# Patient Record
Sex: Female | Born: 1970 | Race: Black or African American | Hispanic: No | Marital: Married | Smoking: Never smoker
Health system: Southern US, Community
[De-identification: ages and names within clinical notes are randomized; demographics above are authoritative.]

## PROBLEM LIST (undated history)

## (undated) DIAGNOSIS — J45909 Unspecified asthma, uncomplicated: Secondary | ICD-10-CM

## (undated) DIAGNOSIS — E669 Obesity, unspecified: Secondary | ICD-10-CM

## (undated) DIAGNOSIS — R87619 Unspecified abnormal cytological findings in specimens from cervix uteri: Secondary | ICD-10-CM

## (undated) DIAGNOSIS — N926 Irregular menstruation, unspecified: Secondary | ICD-10-CM

## (undated) DIAGNOSIS — I10 Essential (primary) hypertension: Secondary | ICD-10-CM

## (undated) DIAGNOSIS — J4521 Mild intermittent asthma with (acute) exacerbation: Secondary | ICD-10-CM

## (undated) DIAGNOSIS — N8500 Endometrial hyperplasia, unspecified: Secondary | ICD-10-CM

## (undated) HISTORY — DX: Essential (primary) hypertension: I10

## (undated) HISTORY — DX: Irregular menstruation, unspecified: N92.6

## (undated) HISTORY — DX: Obesity, unspecified: E66.9

## (undated) HISTORY — DX: Endometrial hyperplasia, unspecified: N85.00

## (undated) HISTORY — DX: Unspecified abnormal cytological findings in specimens from cervix uteri: R87.619

---
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
## 2002-03-19 DIAGNOSIS — I10 Essential (primary) hypertension: Secondary | ICD-10-CM

## 2002-03-19 HISTORY — DX: Essential (primary) hypertension: I10

## 2005-05-29 ENCOUNTER — Ambulatory Visit: Payer: Self-pay | Admitting: Family Medicine

## 2007-11-19 ENCOUNTER — Ambulatory Visit: Payer: Self-pay | Admitting: Unknown Physician Specialty

## 2009-03-19 HISTORY — PX: KNEE SURGERY: SHX244

## 2009-11-08 ENCOUNTER — Ambulatory Visit: Payer: Self-pay

## 2010-03-29 DIAGNOSIS — E282 Polycystic ovarian syndrome: Secondary | ICD-10-CM | POA: Insufficient documentation

## 2012-05-16 ENCOUNTER — Ambulatory Visit: Payer: Self-pay

## 2012-06-09 ENCOUNTER — Other Ambulatory Visit: Payer: Self-pay

## 2012-06-09 ENCOUNTER — Encounter: Payer: Self-pay | Admitting: General Surgery

## 2012-06-09 ENCOUNTER — Ambulatory Visit (INDEPENDENT_AMBULATORY_CARE_PROVIDER_SITE_OTHER): Payer: BC Managed Care – PPO | Admitting: General Surgery

## 2012-06-09 VITALS — BP 112/64 | HR 74 | Resp 14 | Ht 68.0 in | Wt 288.0 lb

## 2012-06-09 DIAGNOSIS — N63 Unspecified lump in unspecified breast: Secondary | ICD-10-CM

## 2012-06-09 DIAGNOSIS — R928 Other abnormal and inconclusive findings on diagnostic imaging of breast: Secondary | ICD-10-CM | POA: Insufficient documentation

## 2012-06-09 NOTE — Progress Notes (Signed)
Patient ID: Ann Morgan, female   DOB: 09-26-1970, 42 y.o.   MRN: [id number]  Chief Complaint  Patient presents with  . mammogram    HPI Edit Ann Morgan is a 42 y.o. female is here today following up from her mammogram 05/16/2012 cat. 2. Patient reports no breast pain. HPI  Past Medical History  Diagnosis Date  . Hypertension 2004    Past Surgical History  Procedure Laterality Date  . Knee surgery Left jan 2011    orthoscopic    Family History  Problem Relation Age of Onset  . Hypertension Mother   . Diabetes Brother   . Hypertension Brother     Social History History  Substance Use Topics  . Smoking status: Never Smoker   . Smokeless tobacco: Never Used  . Alcohol Use: No    No Known Allergies  Current Outpatient Prescriptions  Medication Sig Dispense Refill  . amLODipine (NORVASC) 10 MG tablet Take 10 mg by mouth daily.      . metFORMIN (GLUMETZA) 500 MG (MOD) 24 hr tablet Take 500 mg by mouth daily with breakfast.      . Prenatal Vit-Fe Fumarate-FA (PRENATAL MULTIVITAMIN) TABS Take 1 tablet by mouth daily at 12 noon.      . triamterene-hydrochlorothiazide (DYAZIDE) 37.5-25 MG per capsule Take 1 capsule by mouth every morning.       No current facility-administered medications for this visit.    Review of Systems Review of Systems  Constitutional: Negative.   Respiratory: Negative.   Cardiovascular: Negative.     Blood pressure 112/64, pulse 74, resp. rate 14, height 5\' 8"  (1.727 m), weight 288 lb (130.636 kg), last menstrual period 05/28/2012.  Physical Exam Physical Exam  Constitutional: She appears well-developed and well-nourished.  HENT:  Head: Normocephalic and atraumatic.  Right Ear: External ear normal.  Left Ear: External ear normal.  Cardiovascular: Normal rate, regular rhythm and normal heart sounds.   Pulmonary/Chest: Breath sounds normal. Right breast exhibits no inverted nipple, no mass, no nipple discharge, no skin change and no tenderness.  Left breast exhibits no inverted nipple, no mass, no nipple discharge, no skin change and no tenderness.  Left breast FNA.  Abdominal: Soft. Bowel sounds are normal.    Data Reviewed Bilateral mammograms dated February 11th 2014 showed scattered fibroglandular densities. A nodular density was appreciated in the upper central portion of the left breast for which additional views were requested. By red-0. Focal spot compression views and ultrasound of the left breast at 05/16/2012 showed a well-circumscribed nodule at the 12:00 position the depth. Ultrasound showed a 3 x 5 x 6 cm mass at the 11:00 position corresponding to the mammographic abnormality. BIRAD-4.    Assessment    Left breast cyst.    Plan    The cyst resolved completely on aspiration. Arrangements were made for followup exam and repeat ultrasound in 6 months.       Ann Morgan 06/09/2012, 8:11 PM

## 2012-12-10 ENCOUNTER — Encounter: Payer: Self-pay | Admitting: General Surgery

## 2012-12-10 ENCOUNTER — Other Ambulatory Visit: Payer: BC Managed Care – PPO

## 2012-12-10 ENCOUNTER — Ambulatory Visit (INDEPENDENT_AMBULATORY_CARE_PROVIDER_SITE_OTHER): Payer: BC Managed Care – PPO | Admitting: General Surgery

## 2012-12-10 VITALS — BP 124/84 | HR 76 | Resp 16 | Ht 68.0 in | Wt 280.0 lb

## 2012-12-10 DIAGNOSIS — N63 Unspecified lump in unspecified breast: Secondary | ICD-10-CM

## 2012-12-10 DIAGNOSIS — N6009 Solitary cyst of unspecified breast: Secondary | ICD-10-CM

## 2012-12-10 DIAGNOSIS — N6002 Solitary cyst of left breast: Secondary | ICD-10-CM

## 2012-12-10 NOTE — Patient Instructions (Addendum)
Continue self breast exams. Call office for any new breast issues or concerns. 

## 2012-12-10 NOTE — Progress Notes (Signed)
Patient ID: Ann Morgan, female   DOB: 05-05-1970, 42 y.o.   MRN: [id number]  Chief Complaint  Patient presents with  . Other    breast     HPI Ann Morgan is a 42 y.o. female.  Here today for 6 month breast follow up.  In March 2014 she had fluid drawn from a cyst left breast 12 o'clock position. Denies any new breast issues. The patient had an abnormal mammogram in spring 2014. Ultrasound guided aspiration showed complete resolution of the cyst. This is a plan followup exam to assess for cyst recurrent. HPI  Past Medical History  Diagnosis Date  . Hypertension 2004    Past Surgical History  Procedure Laterality Date  . Knee surgery Left jan 2011    orthoscopic    Family History  Problem Relation Age of Onset  . Hypertension Mother   . Diabetes Brother   . Hypertension Brother   . Breast cancer Maternal Grandmother     Social History History  Substance Use Topics  . Smoking status: Never Smoker   . Smokeless tobacco: Never Used  . Alcohol Use: No    No Known Allergies  Current Outpatient Prescriptions  Medication Sig Dispense Refill  . metFORMIN (GLUMETZA) 500 MG (MOD) 24 hr tablet Take 500 mg by mouth daily with breakfast.      . Prenatal Vit-Fe Fumarate-FA (PRENATAL MULTIVITAMIN) TABS Take 1 tablet by mouth daily at 12 noon.      . triamterene-hydrochlorothiazide (DYAZIDE) 37.5-25 MG per capsule Take 1 capsule by mouth every morning.       No current facility-administered medications for this visit.    Review of Systems Review of Systems  Constitutional: Negative.   Respiratory: Negative.   Cardiovascular: Negative.     Blood pressure 124/84, pulse 76, resp. rate 16, height 5\' 8"  (1.727 m), weight 280 lb (127.007 kg), last menstrual period 11/17/2012.  Physical Exam Physical Exam  Constitutional: She is oriented to person, place, and time. She appears well-developed and well-nourished.  Neck: Neck supple.  Cardiovascular: Normal rate and regular rhythm.    Pulmonary/Chest: Effort normal and breath sounds normal. Right breast exhibits no inverted nipple, no mass, no nipple discharge, no skin change and no tenderness. Left breast exhibits no inverted nipple, no mass, no nipple discharge, no skin change and no tenderness.  Lymphadenopathy:    She has no cervical adenopathy.    She has no axillary adenopathy.  Neurological: She is alert and oriented to person, place, and time.  Skin: Skin is warm and dry.    Data Reviewed Ultrasound examination of the left breast and 11:00 position 10 cm from the nipple showed a slightly lobulated anechoic lesion with faint acoustic enhancement. She was amenable to aspiration. This was completed using 1 cc of 1% plain Xylocaine. The lesion resolved completely. 2 slides were prepared for cytology as the area had recurred. The procedure was well tolerated.  Assessment    Benign breast exam, recurrent breast cyst.    Plan    The patient will be contacted when her cytology is returned. Assuming a benign result, arrangements will be made for her follow up mammogram in 6 months (bilateral).       Earline Mayotte 12/11/2012, 5:57 PM

## 2012-12-11 ENCOUNTER — Encounter: Payer: Self-pay | Admitting: General Surgery

## 2012-12-11 DIAGNOSIS — N6009 Solitary cyst of unspecified breast: Secondary | ICD-10-CM | POA: Insufficient documentation

## 2012-12-12 LAB — FINE-NEEDLE ASPIRATION

## 2012-12-16 ENCOUNTER — Telehealth: Payer: Self-pay

## 2012-12-16 ENCOUNTER — Telehealth: Payer: Self-pay | Admitting: *Deleted

## 2012-12-16 NOTE — Telephone Encounter (Signed)
ERR

## 2012-12-16 NOTE — Telephone Encounter (Signed)
Notified patient as instructed, FNA fine, patient pleased. Discussed follow-up appointments for 6 months, patient agrees

## 2013-03-19 HISTORY — PX: BREAST CYST ASPIRATION: SHX578

## 2013-05-06 ENCOUNTER — Encounter: Payer: Self-pay | Admitting: General Surgery

## 2013-05-06 ENCOUNTER — Ambulatory Visit: Payer: Self-pay | Admitting: General Surgery

## 2013-05-20 ENCOUNTER — Ambulatory Visit (INDEPENDENT_AMBULATORY_CARE_PROVIDER_SITE_OTHER): Payer: BC Managed Care – PPO | Admitting: General Surgery

## 2013-05-20 ENCOUNTER — Encounter: Payer: Self-pay | Admitting: General Surgery

## 2013-05-20 VITALS — BP 134/70 | HR 76 | Resp 14 | Ht 69.0 in | Wt 299.0 lb

## 2013-05-20 DIAGNOSIS — Z1239 Encounter for other screening for malignant neoplasm of breast: Secondary | ICD-10-CM

## 2013-05-20 DIAGNOSIS — N63 Unspecified lump in unspecified breast: Secondary | ICD-10-CM

## 2013-05-20 NOTE — Patient Instructions (Signed)
Patient to return as needed. 

## 2013-05-20 NOTE — Progress Notes (Signed)
Patient ID: Ann Morgan, female   DOB: [birth year], 43 y.o.   MRN: [id number]  Chief Complaint  Patient presents with  . Follow-up    mammogram    HPI Ann Morgan is a 43 y.o. female. who presents for a breast evaluation. The most recent mammogram was done on 05/07/13.Patient does perform regular self breast checks and gets regular mammograms done.  The patient has not appreciated any recurrent breast cysts.  HPI  Past Medical History  Diagnosis Date  . Hypertension 2004    Past Surgical History  Procedure Laterality Date  . Knee surgery Left jan 2011    orthoscopic    Family History  Problem Relation Age of Onset  . Hypertension Mother   . Diabetes Brother   . Hypertension Brother   . Breast cancer Maternal Grandmother     Social History History  Substance Use Topics  . Smoking status: Never Smoker   . Smokeless tobacco: Never Used  . Alcohol Use: No    No Known Allergies  Current Outpatient Prescriptions  Medication Sig Dispense Refill  . metFORMIN (GLUMETZA) 500 MG (MOD) 24 hr tablet Take 500 mg by mouth daily with breakfast.      . Prenatal Vit-Fe Fumarate-FA (PRENATAL MULTIVITAMIN) TABS Take 1 tablet by mouth daily at 12 noon.      . triamterene-hydrochlorothiazide (DYAZIDE) 37.5-25 MG per capsule Take 1 capsule by mouth every morning.       No current facility-administered medications for this visit.    Review of Systems Review of Systems  Constitutional: Negative.   Respiratory: Negative.   Cardiovascular: Negative.     Blood pressure 134/70, pulse 76, resp. rate 14, height 5\' 9"  (1.753 m), weight 299 lb (135.626 kg), last menstrual period 05/11/2013.  Physical Exam Physical Exam  Constitutional: She is oriented to person, place, and time. She appears well-developed and well-nourished.  Eyes: Conjunctivae are normal.  Neck: Neck supple.  Cardiovascular: Normal rate, regular rhythm and normal heart sounds.   Pulmonary/Chest: Breath sounds normal.  Right breast exhibits no inverted nipple, no mass, no nipple discharge, no skin change and no tenderness. Left breast exhibits no inverted nipple, no mass, no nipple discharge, no skin change and no tenderness.  Lymphadenopathy:    She has no cervical adenopathy.    She has no axillary adenopathy.  Neurological: She is alert and oriented to person, place, and time.  Skin: Skin is warm and dry.    Data Reviewed Bilateral mammograms May 06, 2013 were reviewed and compared to previous studies. No interval change. BI-RAD-1. Cyst aspiration in September 2014 showed proteinaceous material. Assessment    Benign breast exam.     Plan    Annual follow up with her primary physician with screening mammograms is recommended. She was encouraged to call should she appreciate any change on her own self exam or if new mammographic issues developed.        Robert Bellow 05/21/2013, 5:53 PM

## 2013-10-19 DIAGNOSIS — J4521 Mild intermittent asthma with (acute) exacerbation: Secondary | ICD-10-CM

## 2013-10-19 HISTORY — DX: Mild intermittent asthma with (acute) exacerbation: J45.21

## 2014-01-18 ENCOUNTER — Encounter: Payer: Self-pay | Admitting: General Surgery

## 2014-05-13 ENCOUNTER — Ambulatory Visit: Payer: Self-pay | Admitting: Obstetrics and Gynecology

## 2014-10-27 DIAGNOSIS — I83813 Varicose veins of bilateral lower extremities with pain: Secondary | ICD-10-CM | POA: Insufficient documentation

## 2015-03-31 ENCOUNTER — Other Ambulatory Visit: Payer: Self-pay | Admitting: *Deleted

## 2015-03-31 DIAGNOSIS — Z1231 Encounter for screening mammogram for malignant neoplasm of breast: Secondary | ICD-10-CM

## 2015-04-06 DIAGNOSIS — Z113 Encounter for screening for infections with a predominantly sexual mode of transmission: Secondary | ICD-10-CM | POA: Insufficient documentation

## 2015-05-09 DIAGNOSIS — S6992XA Unspecified injury of left wrist, hand and finger(s), initial encounter: Secondary | ICD-10-CM | POA: Insufficient documentation

## 2015-05-16 ENCOUNTER — Ambulatory Visit
Admission: RE | Admit: 2015-05-16 | Discharge: 2015-05-16 | Disposition: A | Discharge status: Home / Self Care | Payer: BLUE CROSS/BLUE SHIELD | Source: Ambulatory Visit | Attending: *Deleted | Admitting: *Deleted

## 2015-05-16 DIAGNOSIS — Z1231 Encounter for screening mammogram for malignant neoplasm of breast: Secondary | ICD-10-CM | POA: Diagnosis not present

## 2015-05-16 LAB — HM PAP SMEAR: HM Pap smear: NORMAL

## 2015-06-08 ENCOUNTER — Ambulatory Visit
Admission: EM | Admit: 2015-06-08 | Discharge: 2015-06-08 | Disposition: A | Discharge status: Home / Self Care | Payer: BLUE CROSS/BLUE SHIELD | Attending: Emergency Medicine | Admitting: Emergency Medicine

## 2015-06-08 ENCOUNTER — Encounter: Payer: Self-pay | Admitting: *Deleted

## 2015-06-08 DIAGNOSIS — J101 Influenza due to other identified influenza virus with other respiratory manifestations: Secondary | ICD-10-CM | POA: Diagnosis not present

## 2015-06-08 HISTORY — DX: Unspecified asthma, uncomplicated: J45.909

## 2015-06-08 LAB — RAPID INFLUENZA A&B ANTIGENS (ARMC ONLY): INFLUENZA A (ARMC): NEGATIVE

## 2015-06-08 LAB — RAPID INFLUENZA A&B ANTIGENS: Influenza B (ARMC): POSITIVE — AB

## 2015-06-08 NOTE — Discharge Instructions (Signed)
Take over the counter tylenol or ibuprofen as needed. Rest. Drink plenty of fluids.   Follow up with your primary care physician this week as needed. Return to Urgent care for new or worsening concerns.    Influenza, Adult Influenza ("the flu") is a viral infection of the respiratory tract. It occurs more often in winter months because people spend more time in close contact with one another. Influenza can make you feel very sick. Influenza easily spreads from person to person (contagious). CAUSES  Influenza is caused by a virus that infects the respiratory tract. You can catch the virus by breathing in droplets from an infected person's cough or sneeze. You can also catch the virus by touching something that was recently contaminated with the virus and then touching your mouth, nose, or eyes. RISKS AND COMPLICATIONS You may be at risk for a more severe case of influenza if you smoke cigarettes, have diabetes, have chronic heart disease (such as heart failure) or lung disease (such as asthma), or if you have a weakened immune system. Elderly people and pregnant women are also at risk for more serious infections. The most common problem of influenza is a lung infection (pneumonia). Sometimes, this problem can require emergency medical care and may be life threatening. SIGNS AND SYMPTOMS  Symptoms typically last 4 to 10 days and may include:  Fever.  Chills.  Headache, body aches, and muscle aches.  Sore throat.  Chest discomfort and cough.  Poor appetite.  Weakness or feeling tired.  Dizziness.  Nausea or vomiting. DIAGNOSIS  Diagnosis of influenza is often made based on your history and a physical exam. A nose or throat swab test can be done to confirm the diagnosis. TREATMENT  In mild cases, influenza goes away on its own. Treatment is directed at relieving symptoms. For more severe cases, your health care provider may prescribe antiviral medicines to shorten the sickness.  Antibiotic medicines are not effective because the infection is caused by a virus, not by bacteria. HOME CARE INSTRUCTIONS  Take medicines only as directed by your health care provider.  Use a cool mist humidifier to make breathing easier.  Get plenty of rest until your temperature returns to normal. This usually takes 3 to 4 days.  Drink enough fluid to keep your urine clear or pale yellow.  Cover yourmouth and nosewhen coughing or sneezing,and wash your handswellto prevent thevirusfrom spreading.  Stay homefromwork orschool untilthe fever is gonefor at least 76full day. PREVENTION  An annual influenza vaccination (flu shot) is the best way to avoid getting influenza. An annual flu shot is now routinely recommended for all adults in the [place] IF:  You experiencechest pain, yourcough worsens,or you producemore mucus.  Youhave nausea,vomiting, ordiarrhea.  Your fever returns or gets worse. SEEK IMMEDIATE MEDICAL CARE IF:  You havetrouble breathing, you become short of breath,or your skin ornails becomebluish.  You have severe painor stiffnessin the neck.  You develop a sudden headache, or pain in the face or ear.  You have nausea or vomiting that you cannot control. MAKE SURE YOU:   Understand these instructions.  Will watch your condition.  Will get help right away if you are not doing well or get worse.   This information is not intended to replace advice given to you by your health care provider. Make sure you discuss any questions you have with your health care provider.   Document Released: 03/02/2000 Document Revised: 03/26/2014 Document Reviewed: 06/04/2011 Elsevier Interactive Patient  Education ©2016 [employer] ° °

## 2015-06-08 NOTE — ED Notes (Signed)
Patient started having symptoms of body aches, fever and cough yesterday. Patient reports that she had the flu 3 weeks ago and feels like she has the flu again.

## 2015-06-08 NOTE — ED Provider Notes (Signed)
[facility]  ____________________________________________  Time seen: Approximately 8:53 AM  I have reviewed the triage vital signs and the nursing notes.   HISTORY  Chief Complaint Fever and Generalized Body Aches   HPI Ann Morgan is a 45 y.o. female presents with complaints of runny nose, nasal congestion, body aches and cough since yesterday morning. Patient reports that she did have a fever that was temperature maximum of 102 orally yesterday. Denies sore throat. Patient reports that she did take medications over-the-counter yesterday for the same but states has not taken any medications or antipyretics today. Patient reports that she does feel much better today. Reports continues to eat and drink well.  Patient reports that 3 weeks ago she tested positive for influenza. Patient states that she did not take the Tamiflu. Patient reports that she feels the same as she didn't put she had the flu 3 weeks ago. Patient states that all symptoms were quick onset yesterday. Patient reports that she does have a history of mild asthma. Patient states that she has a home albuterol inhaler as needed. Patient denies wheezing the last 2 days.  Denies chest pain, shortness of breath, abdominal pain, dysuria, neck pain, back pain, rash, dizziness, weakness, abdominal pain.  Patient's last menstrual period was 04/24/2015. Patient reports that she normally has irregular menstruals. Patient states that she is currently on Provera. States that her OB/GYN is currently following and managing her irregular menstruals. Patient states that her and her husband have been sexually active since they've been married and has never used protection as she has never been able to become pregnant. Denies chance of pregnancy.   Past Medical History  Diagnosis Date  . Hypertension 2004  . Asthma     Patient Active Problem List   Diagnosis Date Noted  . Breast cyst 12/11/2012  . Abnormal mammogram  06/09/2012    Past Surgical History  Procedure Laterality Date  . Knee surgery Left jan 2011    orthoscopic  . Breast cyst aspiration Left 2015    benign    Current Outpatient Rx  Name  Route  Sig  Dispense  Refill  . losartan (COZAAR) 50 MG tablet   Oral   Take 50 mg by mouth daily.         . MedroxyPROGESTERone Acetate (PROVERA PO)   Oral   Take by mouth.         . Prenatal Vit-Fe Fumarate-FA (PRENATAL MULTIVITAMIN) TABS   Oral   Take 1 tablet by mouth daily at 12 noon.         . triamterene-hydrochlorothiazide (DYAZIDE) 37.5-25 MG per capsule   Oral   Take 1 capsule by mouth every morning.         . metFORMIN (GLUMETZA) 500 MG (MOD) 24 hr tablet   Oral   Take 500 mg by mouth daily with breakfast.           Allergies Review of patient's allergies indicates no known allergies.  Family History  Problem Relation Age of Onset  . Hypertension Mother   . Diabetes Brother   . Hypertension Brother   . Breast cancer Maternal Grandmother     Social History Social History  Substance Use Topics  . Smoking status: Never Smoker   . Smokeless tobacco: Never Used  . Alcohol Use: No    Review of Systems Constitutional: Subjective fevers. eyes: No visual changes. ENT: No sore throat. positive runny nose, nasal congestion and cough.  Cardiovascular: Denies chest  pain. Respiratory: Denies shortness of breath. Gastrointestinal: No abdominal pain.  No nausea, no vomiting.  No diarrhea.  No constipation. Genitourinary: Negative for dysuria. Musculoskeletal: Negative for back pain. Skin: Negative for rash. Neurological: Negative for headaches, focal weakness or numbness.  10-point ROS otherwise negative.  ____________________________________________   PHYSICAL EXAM:  VITAL SIGNS: ED Triage Vitals  Enc Vitals Group     BP 06/08/15 0837 151/80 mmHg     Pulse Rate 06/08/15 0837 97     Resp 06/08/15 0837 18     Temp 06/08/15 0837 98.5 F (36.9 C)      Temp Source 06/08/15 0837 Oral     SpO2 06/08/15 0837 99 %     Weight 06/08/15 0837 315 lb (142.883 kg)     Height 06/08/15 0837 5\' 9"  (1.753 m)     Head Cir --      Peak Flow --      Pain Score 06/08/15 0843 0     Pain Loc --      Pain Edu? --      Excl. in [place]? --   Constitutional: Alert and oriented. Well appearing and in no acute distress. Eyes: Conjunctivae are normal. PERRL. EOMI. Head: Atraumatic. No sinus tenderness to palpation. No swelling. No erythema.  Ears: no erythema, normal TMs bilaterally.   Nose:Nasal congestion with clear rhinorrhea  Mouth/Throat: Mucous membranes are moist. No pharyngeal erythema. No tonsillar swelling or exudate.  Neck: No stridor.  No cervical spine tenderness to palpation. Hematological/Lymphatic/Immunilogical: No cervical lymphadenopathy. Cardiovascular: Normal rate, regular rhythm. Grossly normal heart sounds.  Good peripheral circulation. Respiratory: Normal respiratory effort.  No retractions. Lungs CTAB.No wheezes, rales or rhonchi. Good air movement.  Gastrointestinal: Soft and nontender. Normal Bowel sounds. No CVA tenderness. Musculoskeletal: No lower or upper extremity tenderness nor edema. No cervical, thoracic or lumbar tenderness to palpation. Neurologic:  Normal speech and language. No gross focal neurologic deficits are appreciated. No gait instability. Skin:  Skin is warm, dry and intact. No rash noted. Psychiatric: Mood and affect are normal. Speech and behavior are normal.  ____________________________________________   LABS (all labs ordered are listed, but only abnormal results are displayed)  Labs Reviewed  RAPID INFLUENZA A&B ANTIGENS ([place]) - Abnormal; Notable for the following:    Influenza B ([facility]) POSITIVE (*)    All other components within normal limits    INITIAL IMPRESSION / ASSESSMENT AND PLAN / ED COURSE  Pertinent labs & imaging results that were available during my care of the patient were reviewed by  me and considered in my medical decision making (see chart for details).   Well-appearing patient. No acute distress. Presents for the complaints of 1 day history of runny nose, distention, cough, body aches and reported fever yesterday. Reports feeling similar to when she had the flu recently. Patient reports she is recently around young kids have been sick recently. Lungs clear throughout. Abdomen soft and nontender. Moist mucous membranes. Suspect viral illness. Will also evaluate for influenza.  Influenza b positive. Patient states that she does not want Tamiflu or any prescription medicines. Patient states that she will take over-the-counter medications as needed. Encourage rest, fluids, over-the-counter Tylenol or ibuprofen as needed. Encourage PCP follow up.  Discussed follow up with Primary care physician this week. Discussed follow up and return parameters including no resolution or any worsening concerns. Patient verbalized understanding and agreed to plan.   ____________________________________________   FINAL CLINICAL IMPRESSION(S) / ED DIAGNOSES  Final diagnoses:  Influenza B      Note: This dictation was prepared with Dragon dictation along with smaller phrase technology. Any transcriptional errors that result from this process are unintentional.    Marylene Land, NP 06/08/15 [phone]

## 2015-09-19 DIAGNOSIS — Z6841 Body Mass Index (BMI) 40.0 and over, adult: Secondary | ICD-10-CM | POA: Insufficient documentation

## 2016-03-13 ENCOUNTER — Other Ambulatory Visit: Payer: Self-pay | Admitting: *Deleted

## 2016-03-13 DIAGNOSIS — Z1231 Encounter for screening mammogram for malignant neoplasm of breast: Secondary | ICD-10-CM

## 2016-04-20 ENCOUNTER — Ambulatory Visit
Admission: RE | Admit: 2016-04-20 | Discharge: 2016-04-20 | Disposition: A | Discharge status: Home / Self Care | Payer: BLUE CROSS/BLUE SHIELD | Source: Ambulatory Visit | Attending: *Deleted | Admitting: *Deleted

## 2016-04-20 ENCOUNTER — Other Ambulatory Visit: Payer: Self-pay | Admitting: *Deleted

## 2016-04-20 DIAGNOSIS — Z1231 Encounter for screening mammogram for malignant neoplasm of breast: Secondary | ICD-10-CM | POA: Diagnosis present

## 2017-01-01 DIAGNOSIS — E7439 Other disorders of intestinal carbohydrate absorption: Secondary | ICD-10-CM | POA: Insufficient documentation

## 2017-01-01 DIAGNOSIS — N3281 Overactive bladder: Secondary | ICD-10-CM | POA: Insufficient documentation

## 2017-01-15 ENCOUNTER — Other Ambulatory Visit: Payer: Self-pay | Admitting: Obstetrics and Gynecology

## 2017-02-04 DIAGNOSIS — M222X1 Patellofemoral disorders, right knee: Secondary | ICD-10-CM | POA: Insufficient documentation

## 2017-02-04 DIAGNOSIS — R55 Syncope and collapse: Secondary | ICD-10-CM | POA: Insufficient documentation

## 2017-03-07 DIAGNOSIS — J309 Allergic rhinitis, unspecified: Secondary | ICD-10-CM | POA: Insufficient documentation

## 2017-03-27 ENCOUNTER — Other Ambulatory Visit: Payer: Self-pay | Admitting: *Deleted

## 2017-03-27 ENCOUNTER — Other Ambulatory Visit: Payer: Self-pay | Admitting: Obstetrics and Gynecology

## 2017-03-27 DIAGNOSIS — Z1231 Encounter for screening mammogram for malignant neoplasm of breast: Secondary | ICD-10-CM

## 2017-05-17 ENCOUNTER — Encounter: Payer: Self-pay | Admitting: Obstetrics and Gynecology

## 2017-05-17 ENCOUNTER — Ambulatory Visit
Admission: RE | Admit: 2017-05-17 | Discharge: 2017-05-17 | Disposition: A | Discharge status: Home / Self Care | Payer: BLUE CROSS/BLUE SHIELD | Source: Ambulatory Visit | Attending: Obstetrics and Gynecology | Admitting: Obstetrics and Gynecology

## 2017-05-17 ENCOUNTER — Ambulatory Visit (INDEPENDENT_AMBULATORY_CARE_PROVIDER_SITE_OTHER): Payer: BLUE CROSS/BLUE SHIELD | Admitting: Obstetrics and Gynecology

## 2017-05-17 VITALS — BP 128/84 | Ht 69.0 in | Wt 312.0 lb

## 2017-05-17 DIAGNOSIS — Z1331 Encounter for screening for depression: Secondary | ICD-10-CM | POA: Diagnosis not present

## 2017-05-17 DIAGNOSIS — Z1231 Encounter for screening mammogram for malignant neoplasm of breast: Secondary | ICD-10-CM | POA: Diagnosis not present

## 2017-05-17 DIAGNOSIS — Z1339 Encounter for screening examination for other mental health and behavioral disorders: Secondary | ICD-10-CM | POA: Diagnosis not present

## 2017-05-17 DIAGNOSIS — Z01419 Encounter for gynecological examination (general) (routine) without abnormal findings: Secondary | ICD-10-CM | POA: Diagnosis not present

## 2017-05-17 DIAGNOSIS — N8501 Benign endometrial hyperplasia: Secondary | ICD-10-CM | POA: Diagnosis not present

## 2017-05-17 NOTE — Progress Notes (Signed)
Gynecology Annual Exam   PCP: Germaine Pomfret, MD  Chief Complaint  Patient presents with  . Annual Exam   History of Present Illness:  Ms. Ann Morgan is a 47 y.o. G0P0000 who LMP was Patient's last menstrual period was 04/15/2017., presents today for her annual examination.  Her menses are regular every 28-30 days, lasting 5-7 day(s).  Dysmenorrhea moderate, occurring first 1-2 days of flow. She does not have intermenstrual bleeding.  She does not have vasomotor sx.  She is single partner, contraception - none. She does not have vaginal dryness.  Last Pap: 05/16/2015  Results were: no abnormalities /neg HPV DNA.  Hx of STDs: none  Last mammogram: 04/20/2016  Results were: normal--routine follow-up in 12 months There is no FH of breast cancer. There is no FH of ovarian cancer. The patient does not do self-breast exams.  Colonoscopy: n/a DEXA: has not been screened for osteoporosis  Tobacco use: The patient denies current or previous tobacco use. Alcohol use: none Exercise: not active  The patient wears seatbelts: yes.     Recent diagnosis of mild-intermittent asthma due to chronic cough and pulmonary function test results. She uses an inhaler occasionally, only when her cough returns.   History of endometrial hyperplasia. She has undergone progesterone treatment and has had resolution of her hyperplasia. She takes Provera 10 mg x 10 days PO each quarter.  She occasionally has heavy menses with large clots.   Past Medical History:  Diagnosis Date  . Abnormal Pap smear of cervix   . Asthma   . Endometrial hyperplasia   . Hypertension 2004  . Irregular menses   . Obesity     Past Surgical History:  Procedure Laterality Date  . BREAST CYST ASPIRATION Left 2015   benign  . KNEE SURGERY Left jan 2011   orthoscopic    Medications   Medication Sig Start Date End Date Taking? Authorizing Provider  medroxyPROGESTERone (PROVERA) 10 MG tablet Take by mouth.    Yes [provider]  telmisartan (MICARDIS) 40 MG tablet Take 40 mg by mouth. 04/17/17 04/17/18 Yes [provider]  triamterene-hydrochlorothiazide (DYAZIDE) 37.5-25 MG per capsule Take 1 capsule by mouth every morning.   Yes [provider]  losartan (COZAAR) 50 MG tablet Take 50 mg by mouth daily.    [provider]  MedroxyPROGESTERone Acetate (PROVERA PO) Take by mouth.    [provider]  metFORMIN (GLUMETZA) 500 MG (MOD) 24 hr tablet Take 500 mg by mouth daily with breakfast.    [provider]  Prenatal Vit-Fe Fumarate-FA (PRENATAL MULTIVITAMIN) TABS Take 1 tablet by mouth daily at 12 noon.    [provider]    Allergies: No Known Allergies  Obstetric History: G0P0000  Family History  Problem Relation Age of Onset  . Hypertension Mother   . Diabetes Brother   . Hypertension Brother   . Breast cancer Maternal Grandmother     Social History   Socioeconomic History  . Marital status: Married    Spouse name: Not on file  . Number of children: Not on file  . Years of education: Not on file  . Highest education level: Not on file  Social Needs  . Financial resource strain: Not on file  . Food insecurity - worry: Not on file  . Food insecurity - inability: Not on file  . Transportation needs - medical: Not on file  . Transportation needs - non-medical: Not on file  Occupational History  . Not on file  Tobacco Use  . Smoking status: Never Smoker  . Smokeless tobacco: Never Used  Substance and Sexual Activity  . Alcohol use: No  . Drug use: No  . Sexual activity: Yes    Partners: Male    Birth control/protection: None  Other Topics Concern  . Not on file  Social History Narrative  . Not on file    Review of Systems  Constitutional: Negative.   HENT: Negative.   Eyes: Negative.   Respiratory: Positive for cough (occasional, not today). Negative for hemoptysis, sputum production, shortness of breath and  wheezing.   Cardiovascular: Negative.   Gastrointestinal: Negative.   Genitourinary: Negative.   Musculoskeletal: Negative.   Skin: Negative.   Neurological: Negative.   Psychiatric/Behavioral: Negative.      Physical Exam BP 128/84   Ht 5\' 9"  (1.753 m)   Wt (!) 312 lb (141.5 kg)   LMP 04/15/2017   BMI 46.07 kg/m   Physical Exam  Constitutional: She is oriented to person, place, and time. She appears well-developed and well-nourished. No distress.  Genitourinary: Uterus normal. Pelvic exam was performed with patient supine. There is no rash, tenderness, lesion or injury on the right labia. There is no rash, tenderness, lesion or injury on the left labia. No erythema, tenderness or bleeding in the vagina. No signs of injury around the vagina. No vaginal discharge found. Right adnexum does not display mass, does not display tenderness and does not display fullness. Left adnexum does not display mass, does not display tenderness and does not display fullness. Cervix does not exhibit motion tenderness, lesion, discharge or polyp.   Uterus is mobile and anteverted. Uterus is not enlarged, tender or exhibiting a mass.  Genitourinary Comments: Bimanual limited by body habitus  HENT:  Head: Normocephalic and atraumatic.  Eyes: EOM are normal. No scleral icterus.  Neck: Normal range of motion. Neck supple. No thyromegaly present.  Cardiovascular: Normal rate and regular rhythm. Exam reveals no gallop and no friction rub.  No murmur heard. Pulmonary/Chest: Effort normal and breath sounds normal. No respiratory distress. She has no wheezes. She has no rales. Right breast exhibits no inverted nipple, no mass, no nipple discharge, no skin change and no tenderness. Left breast exhibits no inverted nipple, no mass, no nipple discharge, no skin change and no tenderness.  Abdominal: Soft. Bowel sounds are normal. She exhibits no distension and no mass. There is no tenderness. There is no rebound and no  guarding.  Exam limited by body habitus  Musculoskeletal: Normal range of motion. She exhibits no edema or tenderness.  Lymphadenopathy:    She has no cervical adenopathy.       Right: No inguinal adenopathy present.       Left: No inguinal adenopathy present.  Neurological: She is alert and oriented to person, place, and time. No cranial nerve deficit.  Skin: Skin is warm and dry. No rash noted. No erythema.  Psychiatric: She has a normal mood and affect. Her behavior is normal. Judgment normal.    Female chaperone present for pelvic and breast  portions of the physical exam  Results: AUDIT Questionnaire (screen for alcoholism): 0 PHQ-9: 0  Assessment: 47 y.o. G0P0000 female here for routine gynecologic examination.  Plan: Problem List Items Addressed This Visit      Genitourinary   Endometrial hyperplasia without atypia, simple   Relevant Medications   medroxyPROGESTERone (PROVERA) 10 MG tablet    Other Visit Diagnoses  Women's annual routine gynecological examination    -  Primary   Screening for depression       Screening for alcoholism          Screening: -- Blood pressure screen normal -- Colonoscopy - not due -- Mammogram - due. Scheduled to undergo today -- Weight screening: obese: discussed management options, including lifestyle, dietary, and exercise. -- Depression screening negative (PHQ-9) -- Nutrition: normal -- cholesterol screening: per PCP -- osteoporosis screening: not due -- tobacco screening: not using -- alcohol screening: AUDIT questionnaire indicates low-risk usage. -- family history of breast cancer screening: done. not at high risk. -- no evidence of domestic violence or intimate partner violence. -- STD screening: gonorrhea/chlamydia NAAT not collected per patient request. -- pap smear not collected per ASCCP guidelines -- flu vaccine received at PCP -- HPV vaccination series: not eligilbe   Endometrial hyperplasia without atypia:  She  continues to have regular menses, though heavy in flow at times.  I recommend to her that she decrease the interval from every 3 months to every 2 months to see if her menses become lighter.  If necessary, she can take provera monthly until her menses are lighter.  If she has breakthrough bleeding, I discussed sampling her endometrium again for recurrence of hyperplasia.  She voiced understanding and agreement with plan.   Prentice Docker, MD 05/18/2017 12:09 PM

## 2017-05-18 ENCOUNTER — Encounter: Payer: Self-pay | Admitting: Obstetrics and Gynecology

## 2017-05-18 MED ORDER — MEDROXYPROGESTERONE ACETATE 10 MG PO TABS: 10.0000 mg | ORAL_TABLET | Freq: Every day | ORAL | 11 refills | Status: DC

## 2017-05-31 ENCOUNTER — Other Ambulatory Visit: Payer: Self-pay | Admitting: Obstetrics and Gynecology

## 2017-05-31 DIAGNOSIS — N8501 Benign endometrial hyperplasia: Secondary | ICD-10-CM

## 2017-06-03 ENCOUNTER — Other Ambulatory Visit: Payer: Self-pay | Admitting: Obstetrics and Gynecology

## 2017-06-03 ENCOUNTER — Encounter: Payer: Self-pay | Admitting: Obstetrics and Gynecology

## 2017-06-03 DIAGNOSIS — N8501 Benign endometrial hyperplasia: Secondary | ICD-10-CM

## 2017-06-03 MED ORDER — MEDROXYPROGESTERONE ACETATE 10 MG PO TABS: 10.0000 mg | ORAL_TABLET | Freq: Every day | ORAL | 3 refills | Status: DC

## 2017-07-15 DIAGNOSIS — Z13220 Encounter for screening for lipoid disorders: Secondary | ICD-10-CM | POA: Diagnosis not present

## 2017-07-15 DIAGNOSIS — I10 Essential (primary) hypertension: Secondary | ICD-10-CM | POA: Diagnosis not present

## 2017-07-15 DIAGNOSIS — E7439 Other disorders of intestinal carbohydrate absorption: Secondary | ICD-10-CM | POA: Diagnosis not present

## 2017-07-15 DIAGNOSIS — L989 Disorder of the skin and subcutaneous tissue, unspecified: Secondary | ICD-10-CM | POA: Insufficient documentation

## 2017-07-15 DIAGNOSIS — Z0000 Encounter for general adult medical examination without abnormal findings: Secondary | ICD-10-CM | POA: Diagnosis not present

## 2017-07-19 DIAGNOSIS — Z13220 Encounter for screening for lipoid disorders: Secondary | ICD-10-CM | POA: Insufficient documentation

## 2017-09-02 ENCOUNTER — Other Ambulatory Visit: Payer: Self-pay | Admitting: Obstetrics and Gynecology

## 2017-09-02 DIAGNOSIS — N8501 Benign endometrial hyperplasia: Secondary | ICD-10-CM

## 2017-09-02 NOTE — Telephone Encounter (Signed)
Pharmacy has been changed. Please advise

## 2017-09-03 NOTE — Telephone Encounter (Signed)
Patient does not need a new prescription.  All she needs to do is call her new pharmacy and ask them to transfer the prescription from her old pharmacy to her new one.  According to my review of her chart, she had a one-year supply sent in March of this year.  If this is not accurate, please let me know and I'll send in a new Rx. Thank you! Prentice Docker, MD

## 2017-09-06 ENCOUNTER — Telehealth: Payer: Self-pay

## 2017-09-06 ENCOUNTER — Other Ambulatory Visit: Payer: Self-pay | Admitting: Obstetrics and Gynecology

## 2017-09-06 DIAGNOSIS — N8501 Benign endometrial hyperplasia: Secondary | ICD-10-CM

## 2017-09-06 MED ORDER — MEDROXYPROGESTERONE ACETATE 10 MG PO TABS: 10.0000 mg | ORAL_TABLET | Freq: Every day | ORAL | 1 refills | Status: DC

## 2017-09-06 NOTE — Telephone Encounter (Signed)
Pt calling stating she saw SDJ in March. He had called in Provera 30 day supply (see note) to make it easier for pick up to [facility]. She has switched to [facility] (changed in chart) Please call in refills to new pharmacy.  SDJ I tried to send this in and got a pop up box saying I wasn't authorized to call in that quantity. Please send in. Thank you.

## 2018-02-17 DIAGNOSIS — L720 Epidermal cyst: Secondary | ICD-10-CM | POA: Insufficient documentation

## 2018-02-17 DIAGNOSIS — Z23 Encounter for immunization: Secondary | ICD-10-CM | POA: Diagnosis not present

## 2018-02-17 DIAGNOSIS — Z6841 Body Mass Index (BMI) 40.0 and over, adult: Secondary | ICD-10-CM | POA: Diagnosis not present

## 2018-02-20 DIAGNOSIS — L720 Epidermal cyst: Secondary | ICD-10-CM | POA: Diagnosis not present

## 2018-02-20 DIAGNOSIS — L810 Postinflammatory hyperpigmentation: Secondary | ICD-10-CM | POA: Diagnosis not present

## 2018-02-21 ENCOUNTER — Other Ambulatory Visit: Payer: Self-pay | Admitting: Obstetrics and Gynecology

## 2018-02-21 DIAGNOSIS — Z1231 Encounter for screening mammogram for malignant neoplasm of breast: Secondary | ICD-10-CM

## 2018-03-04 DIAGNOSIS — J45909 Unspecified asthma, uncomplicated: Secondary | ICD-10-CM | POA: Insufficient documentation

## 2018-03-04 DIAGNOSIS — J4520 Mild intermittent asthma, uncomplicated: Secondary | ICD-10-CM | POA: Diagnosis not present

## 2018-03-28 ENCOUNTER — Other Ambulatory Visit: Payer: Self-pay

## 2018-03-28 ENCOUNTER — Ambulatory Visit
Admission: RE | Admit: 2018-03-28 | Discharge: 2018-03-28 | Disposition: A | Discharge status: Home / Self Care | Payer: BLUE CROSS/BLUE SHIELD | Source: Ambulatory Visit | Attending: Obstetrics and Gynecology | Admitting: Obstetrics and Gynecology

## 2018-03-28 ENCOUNTER — Other Ambulatory Visit (HOSPITAL_COMMUNITY)
Admission: RE | Admit: 2018-03-28 | Discharge: 2018-03-28 | Disposition: A | Discharge status: Home / Self Care | Payer: BLUE CROSS/BLUE SHIELD | Source: Ambulatory Visit | Attending: Obstetrics and Gynecology | Admitting: Obstetrics and Gynecology

## 2018-03-28 ENCOUNTER — Encounter: Payer: Self-pay | Admitting: Obstetrics and Gynecology

## 2018-03-28 ENCOUNTER — Ambulatory Visit (INDEPENDENT_AMBULATORY_CARE_PROVIDER_SITE_OTHER): Payer: BLUE CROSS/BLUE SHIELD | Admitting: Obstetrics and Gynecology

## 2018-03-28 VITALS — BP 104/64 | HR 81 | Ht 69.0 in | Wt 314.0 lb

## 2018-03-28 DIAGNOSIS — N8501 Benign endometrial hyperplasia: Secondary | ICD-10-CM

## 2018-03-28 DIAGNOSIS — Z01419 Encounter for gynecological examination (general) (routine) without abnormal findings: Secondary | ICD-10-CM | POA: Insufficient documentation

## 2018-03-28 DIAGNOSIS — Z1231 Encounter for screening mammogram for malignant neoplasm of breast: Secondary | ICD-10-CM | POA: Diagnosis not present

## 2018-03-28 DIAGNOSIS — Z1339 Encounter for screening examination for other mental health and behavioral disorders: Secondary | ICD-10-CM

## 2018-03-28 DIAGNOSIS — Z124 Encounter for screening for malignant neoplasm of cervix: Secondary | ICD-10-CM | POA: Insufficient documentation

## 2018-03-28 DIAGNOSIS — Z1331 Encounter for screening for depression: Secondary | ICD-10-CM

## 2018-03-28 LAB — HM PAP SMEAR: HM Pap smear: NORMAL

## 2018-03-28 MED ORDER — MEDROXYPROGESTERONE ACETATE 10 MG PO TABS: 10.0000 mg | ORAL_TABLET | Freq: Every day | ORAL | 2 refills | Status: DC

## 2018-03-28 NOTE — Progress Notes (Signed)
Gynecology Annual Exam   PCP: Germaine Pomfret, MD  Chief Complaint  Patient presents with  . Gynecologic Exam    No complaints   History of Present Illness:  Ms. Ann Morgan is a 48 y.o. G0P0000 who LMP was Patient's last menstrual period was 03/03/2018 (approximate)., presents today for her annual examination.  Her menses are regular every 28-30 days, lasting 6 day(s).  Dysmenorrhea moderate, occurring premenstrually. She does not have intermenstrual bleeding.  She does not have vasomotor sx.   She is sexually active - uses nothing for contraception. She does not have vaginal dryness.  Last Pap: 3 years ago  Results were: no abnormalities /neg HPV DNA.  Hx of STDs: none  Last mammogram: today (03/28/2018)  Results were: normal--routine follow-up in 12 months There is no FH of breast cancer. There is no FH of ovarian cancer. The patient does not do self-breast exams.  Colonoscopy: n/a DEXA: has not been screened for osteoporosis  Tobacco use: The patient denies current or previous tobacco use. Alcohol use: none Exercise: not active  The patient wears seatbelts: yes.     Endometrial hyperplasia: she is using the provera once every three months.  On one occasion she noted heavier bleeding, so she started taking the medication every other month.   Past Medical History:  Diagnosis Date  . Abnormal Pap smear of cervix   . Asthma   . Endometrial hyperplasia   . Hypertension 2004  . Irregular menses   . Obesity     Past Surgical History:  Procedure Laterality Date  . BREAST CYST ASPIRATION Left 2015   benign  . KNEE SURGERY Left jan 2011   orthoscopic    Prior to Admission medications   Medication Sig Start Date End Date Taking? Authorizing Provider  medroxyPROGESTERone (PROVERA) 10 MG tablet Take 1 tablet (10 mg total) by mouth daily. Take days 1-10 of each month 09/06/17  Yes Will Bonnet, MD  Prenatal Vit-Fe Fumarate-FA (PRENATAL MULTIVITAMIN)  TABS Take 1 tablet by mouth daily at 12 noon.   Yes [provider]  telmisartan (MICARDIS) 40 MG tablet Take 40 mg by mouth. 04/17/17 04/17/18 Yes [provider]  triamterene-hydrochlorothiazide (DYAZIDE) 37.5-25 MG per capsule Take 1 capsule by mouth every morning.   Yes [provider]   Allergies: No Known Allergies  Obstetric History: G0P0000  Family History  Problem Relation Age of Onset  . Hypertension Mother   . Diabetes Brother   . Hypertension Brother   . Breast cancer Maternal Grandmother 29   Social History   Socioeconomic History  . Marital status: Married    Spouse name: Not on file  . Number of children: Not on file  . Years of education: Not on file  . Highest education level: Not on file  Occupational History  . Not on file  Social Needs  . Financial resource strain: Not on file  . Food insecurity:    Worry: Not on file    Inability: Not on file  . Transportation needs:    Medical: Not on file    Non-medical: Not on file  Tobacco Use  . Smoking status: Never Smoker  . Smokeless tobacco: Never Used  Substance and Sexual Activity  . Alcohol use: No  . Drug use: No  . Sexual activity: Yes    Partners: Male    Birth control/protection: None  Lifestyle  . Physical activity:    Days per week: 3 days  Minutes per session: 30 min  . Stress: Not on file  Relationships  . Social connections:    Talks on phone: Not on file    Gets together: Not on file    Attends religious service: Not on file    Active member of club or organization: Not on file    Attends meetings of clubs or organizations: Not on file    Relationship status: Not on file  . Intimate partner violence:    Fear of current or ex partner: Not on file    Emotionally abused: Not on file    Physically abused: Not on file    Forced sexual activity: Not on file  Other Topics Concern  . Not on file  Social History Narrative  . Not on file    Review of Systems    Constitutional: Negative.   HENT: Negative.   Eyes: Negative.   Respiratory: Negative.   Cardiovascular: Negative.   Gastrointestinal: Negative.   Genitourinary: Negative.   Musculoskeletal: Negative.   Skin: Negative.   Neurological: Negative.   Psychiatric/Behavioral: Negative.     Physical Exam BP 104/64 (BP Location: Left Arm, Patient Position: Sitting, Cuff Size: Large)   Pulse 81   Ht 5\' 9"  (1.753 m)   Wt (!) 314 lb (142.4 kg)   LMP 03/03/2018 (Approximate)   BMI 46.37 kg/m   Physical Exam Constitutional:      General: She is not in acute distress.    Appearance: She is well-developed.  Genitourinary:     Pelvic exam was performed with patient in the lithotomy position.     Vulva, inguinal canal, urethra, bladder and uterus normal.     No posterior fourchette injury present.     No inguinal adenopathy present in the right or left side.    No signs of injury in the vagina.     No vaginal discharge, erythema, tenderness or bleeding.     Cervix is not nulliparous.     No cervical discharge, lesion or polyp.     Uterus is mobile.     Uterus is not enlarged or tender.     No uterine mass detected.    Uterus is anteverted.     No right or left adnexal mass present.     Right adnexa not tender or full.     Left adnexa not tender or full.  HENT:     Head: Normocephalic and atraumatic.  Eyes:     General: No scleral icterus.    Conjunctiva/sclera: Conjunctivae normal.  Neck:     Musculoskeletal: Normal range of motion and neck supple.     Thyroid: No thyromegaly.  Cardiovascular:     Rate and Rhythm: Normal rate and regular rhythm.     Heart sounds: No murmur. No friction rub. No gallop.   Pulmonary:     Effort: Pulmonary effort is normal. No respiratory distress.     Breath sounds: Normal breath sounds. No wheezing or rales.  Chest:     Breasts:        Right: No inverted nipple, mass, nipple discharge, skin change or tenderness.        Left: No inverted  nipple, mass, nipple discharge, skin change or tenderness.  Abdominal:     General: Bowel sounds are normal. There is no distension.     Palpations: Abdomen is soft. There is no mass.     Tenderness: There is no abdominal tenderness. There is no guarding or rebound.  Musculoskeletal:  Normal range of motion.        General: No tenderness.  Lymphadenopathy:     Cervical: No cervical adenopathy.     Lower Body: No right inguinal adenopathy. No left inguinal adenopathy.  Neurological:     Mental Status: She is alert and oriented to person, place, and time.     Cranial Nerves: No cranial nerve deficit.  Skin:    General: Skin is warm and dry.     Findings: No erythema or rash.  Psychiatric:        Behavior: Behavior normal.        Judgment: Judgment normal.     Female chaperone present for pelvic and breast  portions of the physical exam  Results: AUDIT Questionnaire (screen for alcoholism): 0 PHQ-9: 0  Assessment: 48 y.o. G0P0000 female here for routine gynecologic examination.  Plan: Problem List Items Addressed This Visit      Genitourinary   Endometrial hyperplasia without atypia, simple    Other Visit Diagnoses    Women's annual routine gynecological examination    -  Primary   Relevant Orders   Cytology - PAP   Screening for depression       Screening for alcoholism       Pap smear for cervical cancer screening       Relevant Orders   Cytology - PAP     Screening: -- Blood pressure screen managed by PCP -- Colonoscopy - not due -- Mammogram - not due -- Weight screening: obese: discussed management options, including lifestyle, dietary, and exercise. -- Depression screening negative (PHQ-9) -- Nutrition: normal -- cholesterol screening: per PCP -- osteoporosis screening: not due -- tobacco screening: not using -- alcohol screening: AUDIT questionnaire indicates low-risk usage. -- family history of breast cancer screening: done. not at high risk. -- no  evidence of domestic violence or intimate partner violence. -- STD screening: gonorrhea/chlamydia NAAT not collected per patient request. -- pap smear collected per ASCCP guidelines -- flu vaccine received -- HPV vaccination series: not eligilbe  Endometrial Hyperplasia (simple without atypia):  Continue Provera every 2- 3 months as needed in response to bleeding pattern to prevent endometrial hyperplasia.  Prentice Docker, MD 03/28/2018 1:48 PM

## 2018-04-02 LAB — CYTOLOGY - PAP
Diagnosis: NEGATIVE
HPV (WINDOPATH): NOT DETECTED

## 2018-08-23 ENCOUNTER — Other Ambulatory Visit: Payer: Self-pay | Admitting: Family Medicine

## 2018-08-23 DIAGNOSIS — Z20822 Contact with and (suspected) exposure to covid-19: Secondary | ICD-10-CM

## 2018-08-23 DIAGNOSIS — R6889 Other general symptoms and signs: Secondary | ICD-10-CM | POA: Diagnosis not present

## 2018-08-25 LAB — NOVEL CORONAVIRUS, NAA: SARS-CoV-2, NAA: NOT DETECTED

## 2018-08-27 ENCOUNTER — Telehealth: Payer: Self-pay | Admitting: *Deleted

## 2018-08-27 NOTE — Telephone Encounter (Signed)
I called pt and let her know her COVID-19 results were negative and that she does not have the coronavirus. She is feeling fine without any symptoms when I asked. She still has her flyer from the testing site with all the signs/symptoms to look for so  Did not need to go over them with her.  She is already active on Mychart.   "I have a message in my E mail to check my Mychart and I just haven't checked it yet".  She thanked me for calling.

## 2018-09-26 ENCOUNTER — Encounter: Payer: Self-pay | Admitting: Family Medicine

## 2018-09-26 ENCOUNTER — Other Ambulatory Visit: Payer: Self-pay

## 2018-09-26 ENCOUNTER — Ambulatory Visit (INDEPENDENT_AMBULATORY_CARE_PROVIDER_SITE_OTHER): Payer: BLUE CROSS/BLUE SHIELD | Admitting: Family Medicine

## 2018-09-26 VITALS — BP 132/84 | HR 68 | Temp 97.9°F | Resp 18 | Ht 69.0 in | Wt 295.4 lb

## 2018-09-26 DIAGNOSIS — I10 Essential (primary) hypertension: Secondary | ICD-10-CM | POA: Diagnosis not present

## 2018-09-26 DIAGNOSIS — Z6841 Body Mass Index (BMI) 40.0 and over, adult: Secondary | ICD-10-CM | POA: Diagnosis not present

## 2018-09-26 DIAGNOSIS — E282 Polycystic ovarian syndrome: Secondary | ICD-10-CM

## 2018-09-26 DIAGNOSIS — N8501 Benign endometrial hyperplasia: Secondary | ICD-10-CM

## 2018-09-26 DIAGNOSIS — E8881 Metabolic syndrome: Secondary | ICD-10-CM

## 2018-09-26 DIAGNOSIS — Z1159 Encounter for screening for other viral diseases: Secondary | ICD-10-CM | POA: Diagnosis not present

## 2018-09-26 NOTE — Progress Notes (Signed)
Name: Ann Morgan   MRN: [id number]    DOB: 1970/03/20   Date:09/26/2018       Progress Note  Subjective  Chief Complaint  Chief Complaint  Patient presents with  . Establish Care  . Hypertension  . Weight Loss    HPI  Pt presents to establish care and for the following:  Obesity: She has lost 20lbs since January today she is 16. She is walking 4-5 times a week for about 2 miles and doing some strength training.  Diet has been so-so - she has noticed that she is eating more sweets lately.  She has successfully lost weight in the past and came off of her BP meds in the past.  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 - She has not taken medication for weight loss in the past, not interested in this right now. - Has done atkins in the past and was successful.  Her husband is diabetic - have considered ketoacidosis. Has seen a nutritionist in the past - she is not interested in this right now. - She is interested in medical weight management clinic - would like to think about this.  HTN: She is taking her medications as prescribed. No chest pain or shortness of breath, no BLE edema.   Endometrial Hyperplasia: Seeing Dr. Glennon Mac.  Taking Provera PRN for lack of menses once a quarter or so.  No abdominal pain; bleeding has been lighter, some cramping with menses but has been better with lighter cycles.   PCOS/Insulin Resistance: Was diagnosed a few years ago.  She is not taking any medication for this at this time.  She does have acanthosis nigricans to the neck, breasts, thighs, and axilla.   Patient Active Problem List   Diagnosis Date Noted  . Endometrial hyperplasia without atypia, simple 05/17/2017  . Breast cyst 12/11/2012  . Abnormal mammogram 06/09/2012    Past Surgical History:  Procedure Laterality Date  . BREAST CYST ASPIRATION Left 2015   benign  . KNEE SURGERY Left jan 2011   orthoscopic    Family History  Problem Relation Age of  Onset  . Hypertension Mother   . Diabetes Brother   . Hypertension Brother   . Breast cancer Maternal Grandmother 37    Social History   Socioeconomic History  . Marital status: Married    Spouse name: curtis  . Number of children: 0  . Years of education: Not on file  . Highest education level: Not on file  Occupational History  . Not on file  Social Needs  . Financial resource strain: Not hard at all  . Food insecurity    Worry: Never true    Inability: Never true  . Transportation needs    Medical: No    Non-medical: No  Tobacco Use  . Smoking status: Never Smoker  . Smokeless tobacco: Never Used  Substance and Sexual Activity  . Alcohol use: No  . Drug use: No  . Sexual activity: Yes    Partners: Male    Birth control/protection: None  Lifestyle  . Physical activity    Days per week: 4 days    Minutes per session: 30 min  . Stress: Only a little  Relationships  . Social connections    Talks on phone: More than three times a week    Gets together: More than three times a week    Attends religious service: More than 4 times  per year    Active member of club or organization: Yes    Attends meetings of clubs or organizations: More than 4 times per year    Relationship status: Married  . Intimate partner violence    Fear of current or ex partner: No    Emotionally abused: No    Physically abused: No    Forced sexual activity: No  Other Topics Concern  . Not on file  Social History Narrative  . Not on file     Current Outpatient Medications:  .  Prenatal Vit-Fe Fumarate-FA (PRENATAL MULTIVITAMIN) TABS, Take 1 tablet by mouth daily at 12 noon., Disp: , Rfl:  .  telmisartan (MICARDIS) 40 MG tablet, Take 40 mg by mouth., Disp: , Rfl:  .  triamterene-hydrochlorothiazide (DYAZIDE) 37.5-25 MG per capsule, Take 1 capsule by mouth every morning., Disp: , Rfl:  .  medroxyPROGESTERone (PROVERA) 10 MG tablet, Take 1 tablet (10 mg total) by mouth daily. Take days 1-10  of each month (Patient not taking: Reported on 09/26/2018), Disp: 30 tablet, Rfl: 2  No Known Allergies  I personally reviewed active problem list, medication list, allergies, health maintenance, notes from last encounter, lab results with the patient/caregiver today.   ROS Ten systems reviewed and is negative except as mentioned in HPI  Objective  Vitals:   09/26/18 1033  BP: 132/84  Pulse: 68  Resp: 18  Temp: 97.9 F (36.6 C)  TempSrc: Oral  SpO2: 98%  Weight: 295 lb 6.4 oz (134 kg)  Height: 5\' 9"  (1.753 m)   Body mass index is 43.62 kg/m.  Physical Exam Constitutional: Patient appears well-developed and well-nourished. No distress.  HENT: Head: Normocephalic and atraumatic.  Eyes: Conjunctivae and EOM are normal. No scleral icterus.   Neck: Normal range of motion. Neck supple. No JVD present. No thyromegaly present.  Cardiovascular: Normal rate, regular rhythm and normal heart sounds.  No murmur heard. No BLE edema. Pulmonary/Chest: Effort normal and breath sounds normal. No respiratory distress. Musculoskeletal: Normal range of motion, no joint effusions. No gross deformities Neurological: Pt is alert and oriented to person, place, and time. No cranial nerve deficit. Coordination, balance, strength, speech and gait are normal.  Skin: Skin is warm and dry. No rash noted. No erythema.  Psychiatric: Patient has a normal mood and affect. behavior is normal. Judgment and thought content normal.  No results found for this or any previous visit (from the past 72 hour(s)).  PHQ2/9: Depression screen [facility] 2/9 09/26/2018 05/18/2017  Decreased Interest 0 0  Down, Depressed, Hopeless 0 0  PHQ - 2 Score 0 0  Altered sleeping 0 0  Tired, decreased energy 0 0  Change in appetite 0 0  Feeling bad or failure about yourself  0 0  Trouble concentrating 0 0  Moving slowly or fidgety/restless 0 0  Suicidal thoughts 0 0  PHQ-9 Score 0 0  Difficult doing work/chores Not difficult at all  Not difficult at all   PHQ-2/9 Result is negative.    Fall Risk: Fall Risk  09/26/2018  Falls in the past year? 0  Number falls in past yr: 0  Injury with Fall? 0  Follow up Falls evaluation completed   Assessment & Plan  1. Body mass index (BMI) of 45.0-49.9 in adult [facility]) - Will consider medical weight management; she is doing great on her own at this point and is congratulated on this. - Lipid panel - COMPLETE METABOLIC PANEL WITH GFR - Hemoglobin A1c - TSH  2. Essential hypertension - Continue current medications. - DASH diet discussed.  3. Polycystic ovaries - Hemoglobin A1c  4. Insulin resistance - COMPLETE METABOLIC PANEL WITH GFR - Hemoglobin A1c  5. Endometrial hyperplasia without atypia, simple - Seeing Dr. Glennon Mac  6. Need for hepatitis C screening test - Hepatitis C antibody

## 2018-09-26 NOTE — Patient Instructions (Addendum)
[email or web address]   DASH Eating Plan DASH stands for "Dietary Approaches to Stop Hypertension." The DASH eating plan is a healthy eating plan that has been shown to reduce high blood pressure (hypertension). It may also reduce your risk for type 2 diabetes, heart disease, and stroke. The DASH eating plan may also help with weight loss. What are tips for following this plan?  General guidelines  Avoid eating more than 2,300 mg (milligrams) of salt (sodium) a day. If you have hypertension, you may need to reduce your sodium intake to 1,500 mg a day.  Limit alcohol intake to no more than 1 drink a day for nonpregnant women and 2 drinks a day for men. One drink equals 12 oz of beer, 5 oz of wine, or 1 oz of hard liquor.  Work with your health care provider to maintain a healthy body weight or to lose weight. Ask what an ideal weight is for you.  Get at least 30 minutes of exercise that causes your heart to beat faster (aerobic exercise) most days of the week. Activities may include walking, swimming, or biking.  Work with your health care provider or diet and nutrition specialist (dietitian) to adjust your eating plan to your individual calorie needs. Reading food labels   Check food labels for the amount of sodium per serving. Choose foods with less than 5 percent of the Daily Value of sodium. Generally, foods with less than 300 mg of sodium per serving fit into this eating plan.  To find whole grains, look for the word "whole" as the first word in the ingredient list. Shopping  Buy products labeled as "low-sodium" or "no salt added."  Buy fresh foods. Avoid canned foods and premade or frozen meals. Cooking  Avoid adding salt when cooking. Use salt-free seasonings or herbs instead of table salt or sea salt. Check with your health care provider or pharmacist before using salt substitutes.  Do not fry foods. Cook foods using  healthy methods such as baking, boiling, grilling, and broiling instead.  Cook with heart-healthy oils, such as olive, canola, soybean, or sunflower oil. Meal planning  Eat a balanced diet that includes: ? 5 or more servings of fruits and vegetables each day. At each meal, try to fill half of your plate with fruits and vegetables. ? Up to 6-8 servings of whole grains each day. ? Less than 6 oz of lean meat, poultry, or fish each day. A 3-oz serving of meat is about the same size as a deck of cards. One egg equals 1 oz. ? 2 servings of low-fat dairy each day. ? A serving of nuts, seeds, or beans 5 times each week. ? Heart-healthy fats. Healthy fats called Omega-3 fatty acids are found in foods such as flaxseeds and coldwater fish, like sardines, salmon, and mackerel.  Limit how much you eat of the following: ? Canned or prepackaged foods. ? Food that is high in trans fat, such as fried foods. ? Food that is high in saturated fat, such as fatty meat. ? Sweets, desserts, sugary drinks, and other foods with added sugar. ? Full-fat dairy products.  Do not salt foods before eating.  Try to eat at least 2 vegetarian meals each week.  Eat more home-cooked food and less restaurant, buffet, and fast food.  When eating at a restaurant, ask that your food be prepared with less salt or no salt, if possible. What foods are recommended? The items listed may not be a complete list. Talk with  your dietitian about what dietary choices are best for you. Grains Whole-grain or whole-wheat bread. Whole-grain or whole-wheat pasta. Brown rice. [place]. Bulgur. Whole-grain and low-sodium cereals. Pita bread. Low-fat, low-sodium crackers. Whole-wheat flour tortillas. Vegetables Fresh or frozen vegetables (raw, steamed, roasted, or grilled). Low-sodium or reduced-sodium tomato and vegetable juice. Low-sodium or reduced-sodium tomato sauce and tomato paste. Low-sodium or reduced-sodium canned  vegetables. Fruits All fresh, dried, or frozen fruit. Canned fruit in natural juice (without added sugar). Meat and other protein foods Skinless chicken or [place]. Ground chicken or [place]. Pork with fat trimmed off. Fish and seafood. Egg whites. Dried beans, peas, or lentils. Unsalted nuts, nut butters, and seeds. Unsalted canned beans. Lean cuts of beef with fat trimmed off. Low-sodium, lean deli meat. Dairy Low-fat (1%) or fat-free (skim) milk. Fat-free, low-fat, or reduced-fat cheeses. Nonfat, low-sodium ricotta or cottage cheese. Low-fat or nonfat yogurt. Low-fat, low-sodium cheese. Fats and oils Soft margarine without trans fats. Vegetable oil. Low-fat, reduced-fat, or light mayonnaise and salad dressings (reduced-sodium). Canola, safflower, olive, soybean, and sunflower oils. Avocado. Seasoning and other foods Herbs. Spices. Seasoning mixes without salt. Unsalted popcorn and pretzels. Fat-free sweets. What foods are not recommended? The items listed may not be a complete list. Talk with your dietitian about what dietary choices are best for you. Grains Baked goods made with fat, such as croissants, muffins, or some breads. Dry pasta or rice meal packs. Vegetables Creamed or fried vegetables. Vegetables in a cheese sauce. Regular canned vegetables (not low-sodium or reduced-sodium). Regular canned tomato sauce and paste (not low-sodium or reduced-sodium). Regular tomato and vegetable juice (not low-sodium or reduced-sodium). Angie Fava. Olives. Fruits Canned fruit in a light or heavy syrup. Fried fruit. Fruit in cream or butter sauce. Meat and other protein foods Fatty cuts of meat. Ribs. Fried meat. Berniece Salines. Sausage. Bologna and other processed lunch meats. Salami. Fatback. Hotdogs. Bratwurst. Salted nuts and seeds. Canned beans with added salt. Canned or smoked fish. Whole eggs or egg yolks. Chicken or [place] with skin. Dairy Whole or 2% milk, cream, and half-and-half. Whole or full-fat  cream cheese. Whole-fat or sweetened yogurt. Full-fat cheese. Nondairy creamers. Whipped toppings. Processed cheese and cheese spreads. Fats and oils Butter. Stick margarine. Lard. Shortening. Ghee. Bacon fat. Tropical oils, such as coconut, palm kernel, or palm oil. Seasoning and other foods Salted popcorn and pretzels. Onion salt, garlic salt, seasoned salt, table salt, and sea salt. Worcestershire sauce. Tartar sauce. Barbecue sauce. Teriyaki sauce. Soy sauce, including reduced-sodium. Steak sauce. Canned and packaged gravies. Fish sauce. Oyster sauce. Cocktail sauce. Horseradish that you find on the shelf. Ketchup. Mustard. Meat flavorings and tenderizers. Bouillon cubes. Hot sauce and Tabasco sauce. Premade or packaged marinades. Premade or packaged taco seasonings. Relishes. Regular salad dressings. Where to find more information:  National Heart, Lung, and [place]: https://wilson-eaton.com/  American Heart Association: www.heart.org Summary  The DASH eating plan is a healthy eating plan that has been shown to reduce high blood pressure (hypertension). It may also reduce your risk for type 2 diabetes, heart disease, and stroke.  With the DASH eating plan, you should limit salt (sodium) intake to 2,300 mg a day. If you have hypertension, you may need to reduce your sodium intake to 1,500 mg a day.  When on the DASH eating plan, aim to eat more fresh fruits and vegetables, whole grains, lean proteins, low-fat dairy, and heart-healthy fats.  Work with your health care provider or diet and nutrition specialist (dietitian) to adjust your eating  plan to your individual calorie needs. This information is not intended to replace advice given to you by your health care provider. Make sure you discuss any questions you have with your health care provider. Document Released: 02/22/2011 Document Revised: 02/15/2017 Document Reviewed: 02/27/2016 Elsevier Patient Education  2020 [employer].

## 2018-09-30 LAB — LIPID PANEL
Cholesterol: 167 mg/dL (ref <200)
HDL: 50 mg/dL (ref >=50)
LDL Cholesterol (Calc): 103 mg/dL (calc) — ABNORMAL HIGH
Non-HDL Cholesterol (Calc): 117 mg/dL (calc) (ref <130)
Total CHOL/HDL Ratio: 3.3 (calc) (ref <5.0)
Triglycerides: 58 mg/dL (ref <150)

## 2018-09-30 LAB — COMPLETE METABOLIC PANEL WITH GFR
AG Ratio: 1.4 (calc) (ref 1.0–2.5)
ALT: 16 U/L (ref 6–29)
AST: 26 U/L (ref 10–35)
Albumin: 4 g/dL (ref 3.6–5.1)
Alkaline phosphatase (APISO): 70 U/L (ref 31–125)
BUN: 15 mg/dL (ref 7–25)
CO2: 28 mmol/L (ref 20–32)
Calcium: 10.2 mg/dL (ref 8.6–10.2)
Chloride: 103 mmol/L (ref 98–110)
Creat: 1.05 mg/dL (ref 0.50–1.10)
GFR, Est African American: 74 mL/min/{1.73_m2} (ref >=60)
GFR, Est Non African American: 64 mL/min/{1.73_m2} (ref >=60)
Globulin: 2.8 g/dL (calc) (ref 1.9–3.7)
Glucose, Bld: 88 mg/dL (ref 65–99)
Potassium: 3.7 mmol/L (ref 3.5–5.3)
Sodium: 138 mmol/L (ref 135–146)
Total Bilirubin: 0.5 mg/dL (ref 0.2–1.2)
Total Protein: 6.8 g/dL (ref 6.1–8.1)

## 2018-09-30 LAB — HEPATITIS C ANTIBODY
Hepatitis C Ab: NONREACTIVE
SIGNAL TO CUT-OFF: 0.02 (ref <1.00)

## 2018-09-30 LAB — HEMOGLOBIN A1C
Hgb A1c MFr Bld: 5.6 % of total Hgb (ref <5.7)
Mean Plasma Glucose: 114 (calc)
eAG (mmol/L): 6.3 (calc)

## 2018-09-30 LAB — TSH: TSH: 1.31 mIU/L

## 2018-10-19 ENCOUNTER — Ambulatory Visit
Admission: EM | Admit: 2018-10-19 | Discharge: 2018-10-19 | Discharge status: Against Medical Advice | Payer: BLUE CROSS/BLUE SHIELD

## 2018-10-19 ENCOUNTER — Ambulatory Visit
Admission: EM | Admit: 2018-10-19 | Discharge: 2018-10-19 | Disposition: A | Discharge status: Home / Self Care | Payer: BLUE CROSS/BLUE SHIELD | Attending: Emergency Medicine | Admitting: Emergency Medicine

## 2018-10-19 ENCOUNTER — Other Ambulatory Visit: Payer: Self-pay

## 2018-10-19 ENCOUNTER — Ambulatory Visit (INDEPENDENT_AMBULATORY_CARE_PROVIDER_SITE_OTHER): Payer: BLUE CROSS/BLUE SHIELD

## 2018-10-19 ENCOUNTER — Encounter: Payer: Self-pay | Admitting: Emergency Medicine

## 2018-10-19 DIAGNOSIS — S46911A Strain of unspecified muscle, fascia and tendon at shoulder and upper arm level, right arm, initial encounter: Secondary | ICD-10-CM

## 2018-10-19 DIAGNOSIS — Z3202 Encounter for pregnancy test, result negative: Secondary | ICD-10-CM | POA: Diagnosis not present

## 2018-10-19 DIAGNOSIS — M79621 Pain in right upper arm: Secondary | ICD-10-CM | POA: Diagnosis not present

## 2018-10-19 DIAGNOSIS — M79661 Pain in right lower leg: Secondary | ICD-10-CM

## 2018-10-19 DIAGNOSIS — M79652 Pain in left thigh: Secondary | ICD-10-CM

## 2018-10-19 DIAGNOSIS — S8011XA Contusion of right lower leg, initial encounter: Secondary | ICD-10-CM | POA: Diagnosis not present

## 2018-10-19 DIAGNOSIS — R252 Cramp and spasm: Secondary | ICD-10-CM

## 2018-10-19 DIAGNOSIS — S4991XA Unspecified injury of right shoulder and upper arm, initial encounter: Secondary | ICD-10-CM | POA: Diagnosis not present

## 2018-10-19 LAB — PREGNANCY, URINE: Preg Test, Ur: NEGATIVE

## 2018-10-19 MED ORDER — CYCLOBENZAPRINE HCL 10 MG PO TABS: 10.0000 mg | ORAL_TABLET | Freq: Two times a day (BID) | ORAL | 0 refills | Status: AC | PRN

## 2018-10-19 MED ORDER — IBUPROFEN 800 MG PO TABS: 800.0000 mg | ORAL_TABLET | Freq: Three times a day (TID) | ORAL | 0 refills | Status: DC
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
## 2018-10-24 ENCOUNTER — Other Ambulatory Visit: Payer: Self-pay

## 2018-10-24 ENCOUNTER — Ambulatory Visit (INDEPENDENT_AMBULATORY_CARE_PROVIDER_SITE_OTHER): Payer: BLUE CROSS/BLUE SHIELD | Admitting: Orthopaedic Surgery

## 2018-10-24 ENCOUNTER — Encounter: Payer: Self-pay | Admitting: Physician Assistant

## 2018-10-24 ENCOUNTER — Ambulatory Visit: Payer: Self-pay

## 2018-10-24 VITALS — Ht 69.0 in | Wt 290.0 lb

## 2018-10-24 DIAGNOSIS — M25512 Pain in left shoulder: Secondary | ICD-10-CM

## 2018-10-24 DIAGNOSIS — Z0189 Encounter for other specified special examinations: Secondary | ICD-10-CM

## 2018-10-24 DIAGNOSIS — M25511 Pain in right shoulder: Secondary | ICD-10-CM

## 2018-10-24 MED ORDER — LIDOCAINE HCL 2 % IJ SOLN
2.0000 mL | INTRAMUSCULAR | Status: AC | PRN
Start: 2018-10-24 — End: 2018-10-24
  Administered 2018-10-24: 11:00:00 2 mL

## 2018-10-24 MED ORDER — BUPIVACAINE HCL 0.25 % IJ SOLN
2.0000 mL | INTRAMUSCULAR | Status: AC | PRN
Start: 2018-10-24 — End: 2018-10-24
  Administered 2018-10-24: 11:00:00 2 mL via INTRA_ARTICULAR

## 2018-10-24 MED ORDER — METHYLPREDNISOLONE ACETATE 40 MG/ML IJ SUSP
40.0000 mg | INTRAMUSCULAR | Status: AC | PRN
Start: 2018-10-24 — End: 2018-10-24
  Administered 2018-10-24: 11:00:00 40 mg via INTRA_ARTICULAR
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
## 2018-11-07 ENCOUNTER — Ambulatory Visit: Payer: BLUE CROSS/BLUE SHIELD | Admitting: Family Medicine

## 2018-11-07 ENCOUNTER — Encounter: Payer: Self-pay | Admitting: Family Medicine

## 2018-11-07 ENCOUNTER — Other Ambulatory Visit: Payer: Self-pay

## 2018-11-07 ENCOUNTER — Telehealth: Payer: Self-pay | Admitting: Physician Assistant

## 2018-11-07 DIAGNOSIS — G8929 Other chronic pain: Secondary | ICD-10-CM

## 2018-11-07 NOTE — Telephone Encounter (Signed)
Patient calld seen Mendel Ryder and stated not any better and was told sh could get an MRI.  Please call patient [phone]

## 2018-11-07 NOTE — Telephone Encounter (Signed)
Sent to MRI scheduling

## 2018-11-08 ENCOUNTER — Other Ambulatory Visit: Payer: Self-pay

## 2018-11-08 DIAGNOSIS — Z20822 Contact with and (suspected) exposure to covid-19: Secondary | ICD-10-CM

## 2018-11-09 LAB — NOVEL CORONAVIRUS, NAA: SARS-CoV-2, NAA: NOT DETECTED

## 2018-11-10 NOTE — Progress Notes (Signed)
Erroneous

## 2018-11-18 ENCOUNTER — Ambulatory Visit
Admission: RE | Admit: 2018-11-18 | Discharge: 2018-11-18 | Disposition: A | Discharge status: Home / Self Care | Payer: BLUE CROSS/BLUE SHIELD | Source: Ambulatory Visit | Attending: Orthopaedic Surgery | Admitting: Orthopaedic Surgery

## 2018-11-18 ENCOUNTER — Other Ambulatory Visit: Payer: Self-pay

## 2018-11-18 DIAGNOSIS — S46011A Strain of muscle(s) and tendon(s) of the rotator cuff of right shoulder, initial encounter: Secondary | ICD-10-CM | POA: Diagnosis not present

## 2018-11-18 DIAGNOSIS — M25511 Pain in right shoulder: Secondary | ICD-10-CM

## 2018-11-18 DIAGNOSIS — G8929 Other chronic pain: Secondary | ICD-10-CM

## 2018-11-20 ENCOUNTER — Ambulatory Visit (INDEPENDENT_AMBULATORY_CARE_PROVIDER_SITE_OTHER): Payer: BLUE CROSS/BLUE SHIELD | Admitting: Orthopaedic Surgery

## 2018-11-20 DIAGNOSIS — S46011A Strain of muscle(s) and tendon(s) of the rotator cuff of right shoulder, initial encounter: Secondary | ICD-10-CM | POA: Diagnosis not present

## 2018-11-20 NOTE — Progress Notes (Signed)
Office Visit Note   Patient: Ann Morgan           Date of Birth: [birth year]           MRN: [id number] Visit Date: 11/20/2018              Requested by: Hubbard Hartshorn, [address] [place],  [place] [address] PCP: Hubbard Hartshorn, FNP   Assessment & Plan: Visit Diagnoses:  1. Traumatic complete tear of right rotator cuff, initial encounter     Plan: MRI findings are consistent with massive rotator cuff tear of the supraspinatus and infraspinatus and upper half of the subscapularis.  Patient is young and that this is an acute traumatic tear.  This is her dominant arm.  We will have Dr. Marlou Sa evaluate her and have a full discussion for surgery.  Please refer to his note for further details.  Follow-Up Instructions: No follow-ups on file.   Orders:  No orders of the defined types were placed in this encounter.  No orders of the defined types were placed in this encounter.     Procedures: No procedures performed   Clinical Data: No additional findings.   Subjective: Chief Complaint  Patient presents with  . Right Arm - Follow-up    Ann Morgan returns today for review of her right shoulder MRI.  She still has significant weakness with shoulder function and associated pain.   Review of Systems   Objective: Vital Signs: There were no vitals taken for this visit.  Physical Exam  Ortho Exam Right shoulder exam shows positive drop arm and inability to perform empty can test.  Positive bearhug and mildly positive belly press.  Infraspinatus is noticeably weak.  Unable to raise arm above shoulder. Specialty Comments:  No specialty comments available.  Imaging: No results found.   PMFS History: Patient Active Problem List   Diagnosis Date Noted  . Endometrial hyperplasia without atypia, simple 05/17/2017  . Allergic rhinitis 03/07/2017  . Patellofemoral pain syndrome of both knees 02/04/2017  . Glucose intolerance 01/01/2017  . Overactive  bladder 01/01/2017  . Body mass index (BMI) of 45.0-49.9 in adult ([place]) 09/19/2015  . Breast cyst 12/11/2012  . Abnormal mammogram 06/09/2012  . Polycystic ovaries 03/29/2010   Past Medical History:  Diagnosis Date  . Abnormal Pap smear of cervix   . Asthma   . Endometrial hyperplasia   . Hypertension 2004  . Irregular menses   . Mild intermittent asthma with acute exacerbation 10/19/2013   Late 2013 with a cough that lasted approximately one month.  PFTs showed FEV1 of 83%  Last Assessment & Plan:  Reports that since she was prescribed albuterol, she has used it maybe 1x to see how she would respond to it which seemed to help. However, she still has some coughing issues but thinks it is manageable. No issues with high pollen season.   Plan: - Continue with albuterol PRN.  - She will  . Obesity     Family History  Problem Relation Age of Onset  . Hypertension Mother   . Diabetes Brother   . Hypertension Brother   . Breast cancer Maternal Grandmother 75    Past Surgical History:  Procedure Laterality Date  . BREAST CYST ASPIRATION Left 2015   benign  . KNEE SURGERY Left jan 2011   orthoscopic   Social History   Occupational History  . Not on file  Tobacco Use  . Smoking status: Never Smoker  .  Smokeless tobacco: Never Used  Substance and Sexual Activity  . Alcohol use: No  . Drug use: No  . Sexual activity: Yes    Partners: Male    Birth control/protection: None

## 2018-12-16 ENCOUNTER — Other Ambulatory Visit: Payer: BLUE CROSS/BLUE SHIELD

## 2018-12-19 ENCOUNTER — Ambulatory Visit: Payer: BLUE CROSS/BLUE SHIELD | Admitting: Orthopaedic Surgery

## 2018-12-19 NOTE — Pre-Procedure Instructions (Signed)
[id number] - [place], [place] - [place] [address] [place] [address] Phone: [phone] Fax: [place] [id number] [place], [place] - [address] AT [address] [place] [address] Phone: [phone] Fax: [phone]      Your procedure is scheduled on Wednesday, October 8th.  Report to [facility] Main Entrance "A" at 5:30 A.M., and check in at the Admitting office.  Call this number if you have problems the morning of surgery:  [phone]  Call [phone] if you have any questions prior to your surgery date Monday-Friday 8am-4pm    Remember:  Do not eat after midnight the night before your surgery  You may drink clear liquids until 4:30 the morning of your surgery.   Clear liquids allowed are: Water, Non-Citrus Juices (without pulp), Carbonated Beverages, Clear Tea, Black Coffee Only, and Gatorade    Take these medicines the morning of surgery with A SIP OF WATER: NONE   As of today, STOP taking any Aspirin (unless otherwise instructed by your surgeon), Aleve, Naproxen, Ibuprofen, Motrin, Advil, Goody's, BC's, all herbal medications, fish oil, and all vitamins.    The Morning of Surgery  Do not wear jewelry, make-up or nail polish.  Do not wear lotions, powders, or perfumes, or deodorant  Do not shave 48 hours prior to surgery.    Do not bring valuables to the hospital.  [facility] is not responsible for any belongings or valuables.  If you are a smoker, DO NOT Smoke 24 hours prior to surgery IF you wear a CPAP at night please bring your mask, tubing, and machine the morning of surgery   Remember that you must have someone to transport you home after your surgery, and remain with you for 24 hours if you are discharged the same day.   Contacts, glasses, hearing aids, dentures or bridgework may not be  worn into surgery.    Leave your suitcase in the car.  After surgery it may be brought to your room.  For patients admitted to the hospital, discharge time will be determined by your treatment team.  Patients discharged the day of surgery will not be allowed to drive home.    Special instructions:   [facility]- Preparing For Surgery  Before surgery, you can play an important role. Because skin is not sterile, your skin needs to be as free of germs as possible. You can reduce the number of germs on your skin by washing with CHG (chlorahexidine gluconate) Soap before surgery.  CHG is an antiseptic cleaner which kills germs and bonds with the skin to continue killing germs even after washing.    Oral Hygiene is also important to reduce your risk of infection.  Remember - BRUSH YOUR TEETH THE MORNING OF SURGERY WITH YOUR REGULAR TOOTHPASTE  Please do not use if you have an allergy to CHG or antibacterial soaps. If your skin becomes reddened/irritated stop using the CHG.  Do not shave (including legs and underarms) for at least 48 hours prior to first CHG shower. It is OK to shave your face.  Please follow these instructions carefully.   1. Shower the NIGHT BEFORE SURGERY and the MORNING OF SURGERY with CHG Soap.   2. If you chose to wash your hair, wash your hair first as usual with your normal shampoo.  3. After you  shampoo, rinse your hair and body thoroughly to remove the shampoo.  4. Use CHG as you would any other liquid soap. You can apply CHG directly to the skin and wash gently with a scrungie or a clean washcloth.   5. Apply the CHG Soap to your body ONLY FROM THE NECK DOWN.  Do not use on open wounds or open sores. Avoid contact with your eyes, ears, mouth and genitals (private parts). Wash Face and genitals (private parts)  with your normal soap.   6. Wash thoroughly, paying special attention to the area where your surgery will be performed.  7. Thoroughly rinse your body  with warm water from the neck down.  8. DO NOT shower/wash with your normal soap after using and rinsing off the CHG Soap.  9. Pat yourself dry with a CLEAN TOWEL.  10. Wear CLEAN PAJAMAS to bed the night before surgery, wear comfortable clothes the morning of surgery  11. Place CLEAN SHEETS on your bed the night of your first shower and DO NOT SLEEP WITH PETS.    Day of Surgery:  Do not apply any deodorants/lotions. Please shower the morning of surgery with the CHG soap  Please wear clean clothes to the hospital/surgery center.   Remember to brush your teeth WITH YOUR REGULAR TOOTHPASTE.   Please read over the following fact sheets that you were given.

## 2018-12-22 ENCOUNTER — Other Ambulatory Visit: Payer: Self-pay

## 2018-12-22 ENCOUNTER — Other Ambulatory Visit (HOSPITAL_COMMUNITY)
Admission: RE | Admit: 2018-12-22 | Discharge: 2018-12-22 | Disposition: A | Discharge status: Home / Self Care | Payer: BLUE CROSS/BLUE SHIELD | Source: Ambulatory Visit | Attending: Orthopedic Surgery | Admitting: Orthopedic Surgery

## 2018-12-22 ENCOUNTER — Encounter (HOSPITAL_COMMUNITY)
Admission: RE | Admit: 2018-12-22 | Discharge: 2018-12-22 | Disposition: A | Discharge status: Home / Self Care | Payer: BLUE CROSS/BLUE SHIELD | Source: Ambulatory Visit | Attending: Orthopedic Surgery | Admitting: Orthopedic Surgery

## 2018-12-22 ENCOUNTER — Encounter (HOSPITAL_COMMUNITY): Payer: Self-pay

## 2018-12-22 DIAGNOSIS — Z791 Long term (current) use of non-steroidal anti-inflammatories (NSAID): Secondary | ICD-10-CM | POA: Diagnosis not present

## 2018-12-22 DIAGNOSIS — I10 Essential (primary) hypertension: Secondary | ICD-10-CM | POA: Diagnosis not present

## 2018-12-22 DIAGNOSIS — Z6841 Body Mass Index (BMI) 40.0 and over, adult: Secondary | ICD-10-CM | POA: Insufficient documentation

## 2018-12-22 DIAGNOSIS — Z20828 Contact with and (suspected) exposure to other viral communicable diseases: Secondary | ICD-10-CM | POA: Insufficient documentation

## 2018-12-22 DIAGNOSIS — Z79899 Other long term (current) drug therapy: Secondary | ICD-10-CM | POA: Diagnosis not present

## 2018-12-22 DIAGNOSIS — J45909 Unspecified asthma, uncomplicated: Secondary | ICD-10-CM | POA: Insufficient documentation

## 2018-12-22 DIAGNOSIS — I498 Other specified cardiac arrhythmias: Secondary | ICD-10-CM | POA: Diagnosis not present

## 2018-12-22 DIAGNOSIS — Z01818 Encounter for other preprocedural examination: Secondary | ICD-10-CM | POA: Diagnosis not present

## 2018-12-22 LAB — BASIC METABOLIC PANEL
Anion gap: 7 (ref 5–15)
BUN: 14 mg/dL (ref 6–20)
CO2: 28 mmol/L (ref 22–32)
Calcium: 9.3 mg/dL (ref 8.9–10.3)
Chloride: 104 mmol/L (ref 98–111)
Creatinine, Ser: 1.11 mg/dL — ABNORMAL HIGH (ref 0.44–1.00)
GFR calc Af Amer: >60 mL/min (ref >60)
GFR calc non Af Amer: 60 mL/min — ABNORMAL LOW (ref >60)
Glucose, Bld: 85 mg/dL (ref 70–99)
Potassium: 3.5 mmol/L (ref 3.5–5.1)
Sodium: 139 mmol/L (ref 135–145)

## 2018-12-22 LAB — CBC
HCT: 36.6 % (ref 36.0–46.0)
Hemoglobin: 11.7 g/dL — ABNORMAL LOW (ref 12.0–15.0)
MCH: 29.5 pg (ref 26.0–34.0)
MCHC: 32 g/dL (ref 30.0–36.0)
MCV: 92.4 fL (ref 80.0–100.0)
Platelets: 381 10*3/uL (ref 150–400)
RBC: 3.96 MIL/uL (ref 3.87–5.11)
RDW: 14.2 % (ref 11.5–15.5)
WBC: 6 10*3/uL (ref 4.0–10.5)
nRBC: 0 % (ref 0.0–0.2)

## 2018-12-22 NOTE — Pre-Procedure Instructions (Signed)
[id number] - [place], [place] - [place] AT [address] [place] [address] Phone: [phone] Fax: [place] [id number] [place], [place] - [place] AT [address] [place] [address] Phone: [phone] Fax: [phone]      Your procedure is scheduled on Wednesday, October 8th.  Report to [facility] Main Entrance "A" at 5:30 A.M., and check in at the Admitting office.  Call this number if you have problems the morning of surgery:  [phone]  Call [phone] if you have any questions prior to your surgery date Monday-Friday 8am-4pm    Remember:  Do not eat after midnight the night before your surgery  You may drink clear liquids until 4:30 the morning of your surgery.   Clear liquids allowed are: Water, Non-Citrus Juices (without pulp), Carbonated Beverages, Clear Tea, Black Coffee Only, and Gatorade  Please complete your PRE-SURGERY ENSURE that was provided to you by 0430 the morning of surgery.  Please, if able, drink it in one setting. DO NOT SIP.     Take these medicines the morning of surgery with A SIP OF WATER: NONE   As of today, STOP taking any Aspirin (unless otherwise instructed by your surgeon), Aleve, Naproxen, Ibuprofen, Motrin, Advil, Goody's, BC's, all herbal medications, fish oil, and all vitamins.    The Morning of Surgery  Do not wear jewelry, make-up or nail polish.  Do not wear lotions, powders, or perfumes, or deodorant  Do not shave 48 hours prior to surgery.    Do not bring valuables to the hospital.  [facility] is not responsible for any belongings or valuables.  If you are a smoker, DO NOT Smoke 24 hours prior to surgery IF you wear a CPAP at night please bring your mask, tubing, and machine the morning of surgery   Remember that you must have someone to transport you home after  your surgery, and remain with you for 24 hours if you are discharged the same day.   Contacts, glasses, hearing aids, dentures or bridgework may not be worn into surgery.    Leave your suitcase in the car.  After surgery it may be brought to your room.  For patients admitted to the hospital, discharge time will be determined by your treatment team.  Patients discharged the day of surgery will not be allowed to drive home.    Special instructions:   [facility]- Preparing For Surgery  Before surgery, you can play an important role. Because skin is not sterile, your skin needs to be as free of germs as possible. You can reduce the number of germs on your skin by washing with CHG (chlorahexidine gluconate) Soap before surgery.  CHG is an antiseptic cleaner which kills germs and bonds with the skin to continue killing germs even after washing.    Oral Hygiene is also important to reduce your risk of infection.  Remember - BRUSH YOUR TEETH THE MORNING OF SURGERY WITH YOUR REGULAR TOOTHPASTE  Please do not use if you have an allergy to CHG or antibacterial soaps. If your skin becomes reddened/irritated stop using the CHG.  Do not shave (including legs and underarms) for at least 48 hours prior to first CHG shower. It is OK to shave your face.  Please follow these instructions carefully.   1. Shower the NIGHT BEFORE SURGERY and the [facility]  OF SURGERY with CHG Soap.   2. If you chose to wash your hair, wash your hair first as usual with your normal shampoo.  3. After you shampoo, rinse your hair and body thoroughly to remove the shampoo.  4. Use CHG as you would any other liquid soap. You can apply CHG directly to the skin and wash gently with a scrungie or a clean washcloth.   5. Apply the CHG Soap to your body ONLY FROM THE NECK DOWN.  Do not use on open wounds or open sores. Avoid contact with your eyes, ears, mouth and genitals (private parts). Wash Face and genitals (private parts)   with your normal soap.   6. Wash thoroughly, paying special attention to the area where your surgery will be performed.  7. Thoroughly rinse your body with warm water from the neck down.  8. DO NOT shower/wash with your normal soap after using and rinsing off the CHG Soap.  9. Pat yourself dry with a CLEAN TOWEL.  10. Wear CLEAN PAJAMAS to bed the night before surgery, wear comfortable clothes the morning of surgery  11. Place CLEAN SHEETS on your bed the night of your first shower and DO NOT SLEEP WITH PETS.    Day of Surgery:  Do not apply any deodorants/lotions. Please shower the morning of surgery with the CHG soap  Please wear clean clothes to the hospital/surgery center.   Remember to brush your teeth WITH YOUR REGULAR TOOTHPASTE.   Please read over the following fact sheets that you were given.

## 2018-12-22 NOTE — Progress Notes (Signed)
During PAT visit, patient had concerns about surgery. Per patient, when previously discussed with Dr. Marlou Sa, the surgery was not going to be arthroscopic. Called Dr. Randel Pigg Office and spoke with Malachy Mood to make her aware about patient's concerns.

## 2018-12-22 NOTE — Progress Notes (Addendum)
PCP - Raelyn Ensign, FNP Cardiologist - Denies  PPM/ICD - N/A Device Orders - N/A Rep Notified N/A  Chest x-ray - N/A  EKG - 12/22/2018 Stress Test - N/A ECHO - N/A Cardiac Cath -  N/A  Sleep Study - Denies CPAP - N/A  Fasting Blood Sugar - N/A Checks Blood Sugar __N/A___ times a day  Blood Thinner Instructions: N/A Aspirin Instructions: N/A  ERAS Protcol - Yes PRE-SURGERY Ensure - Yes  COVID TEST- 12/22/2018   Anesthesia review: Yes, Abnormal EKG  Patient denies shortness of breath, fever, cough and chest pain at PAT appointment    Coronavirus Screening  Have you experienced the following symptoms:  Cough yes/no: No Fever (>100.83F)  yes/no: No Runny nose yes/no: No Sore throat yes/no: No Difficulty breathing/shortness of breath  yes/no: No  Have you or a family member traveled in the last 14 days and where? yes/no: No   If the patient indicates "YES" to the above questions, their PAT will be rescheduled to limit the exposure to others and, the surgeon will be notified. THE PATIENT WILL NEED TO BE ASYMPTOMATIC FOR 14 DAYS.   If the patient is not experiencing any of these symptoms, the PAT nurse will instruct them to NOT bring anyone with them to their appointment since they may have these symptoms or traveled as well.   Please remind your patients and families that hospital visitation restrictions are in effect and the importance of the restrictions.     Patient verbalized understanding of instructions that were given to them at the PAT appointment. Patient was also instructed that they will need to review over the PAT instructions again at home before surgery.

## 2018-12-23 ENCOUNTER — Telehealth: Payer: Self-pay | Admitting: Orthopedic Surgery

## 2018-12-23 LAB — NOVEL CORONAVIRUS, NAA (HOSP ORDER, SEND-OUT TO REF LAB; TAT 18-24 HRS): SARS-CoV-2, NAA: NOT DETECTED

## 2018-12-23 NOTE — Progress Notes (Signed)
Anesthesia Chart Review:  Case: [id number] Date/Time: 12/25/18 0715   Procedure: right shoulder arthroscopy, biceps tendon release with tenodesis, three tendon rotator cuff repair (Right )   Anesthesia type: General   Pre-op diagnosis: massive 3 tendon rotator cuff tear   Location: [facility] OR ROOM 06 / [facility]   Surgeon: Meredith Pel, MD      DISCUSSION: Patient is a 48 year old female scheduled for the above procedure.  History includes never smoker, HTN, asthma. BMI is consistent with morbid obesity.  She denied shortness of breath, cough, fever, chest pain at PAT RN visit.  Presurgical COVID-19 test negative on 12/22/2018.  If no acute changes and I would anticipate that she could proceed as planned.  She will need a urine pregnancy test on the day of surgery.   VS: BP 125/74   Pulse 81   Temp 36.8 C   Resp 20   Ht 5\' 9"  (1.753 m)   Wt 131.9 kg   LMP 12/18/2018   SpO2 100%   BMI 42.93 kg/m   PROVIDERS: Hubbard Hartshorn, FNP is PCP   LABS: Labs reviewed: Acceptable for surgery. (all labs ordered are listed, but only abnormal results are displayed)  Labs Reviewed  BASIC METABOLIC PANEL - Abnormal; Notable for the following components:      Result Value   Creatinine, Ser 1.11 (*)    GFR calc non Af Amer 60 (*)    All other components within normal limits  CBC - Abnormal; Notable for the following components:   Hemoglobin 11.7 (*)    All other components within normal limits     IMAGES: MRI right shoulder 11/18/18: IMPRESSION: 1. Complete full-thickness tears of the supraspinatus and infraspinatus tendons with approximately 2.90 cm of tendon retraction and edema in the muscle bellies. There is resultant slightly high-riding humeral head and fluid in the subacromial-subdeltoid bursa. 2. High-grade partial tear of the superior subscapularis tendon with edema at the myotendinous junction 3. Intra-articular longhead biceps tendinosis 4. Small glenohumeral joint  effusion 5. Moderate AC joint arthrosis   EKG: 12/22/18: Normal sinus rhythm with sinus arrhythmia Cannot rule out Anterior infarct , age undetermined No previous tracing Confirmed by Croitoru, Mihai [phone]) on 12/22/2018 8:21:12 PM   CV: N/A  Past Medical History:  Diagnosis Date  . Abnormal Pap smear of cervix   . Asthma   . Endometrial hyperplasia   . Hypertension 2004  . Irregular menses   . Mild intermittent asthma with acute exacerbation 10/19/2013   Late 2013 with a cough that lasted approximately one month.  PFTs showed FEV1 of 83%  Last Assessment & Plan:  Reports that since she was prescribed albuterol, she has used it maybe 1x to see how she would respond to it which seemed to help. However, she still has some coughing issues but thinks it is manageable. No issues with high pollen season.   Plan: - Continue with albuterol PRN.  - She will  . Obesity     Past Surgical History:  Procedure Laterality Date  . BREAST CYST ASPIRATION Left 2015   benign  . KNEE SURGERY Left jan 2011   orthoscopic    MEDICATIONS: . ibuprofen (ADVIL) 800 MG tablet  . medroxyPROGESTERone (PROVERA) 10 MG tablet  . naproxen sodium (ALEVE) 220 MG tablet  . Prenatal Vit-Fe Fumarate-FA (PRENATAL MULTIVITAMIN) TABS  . telmisartan (MICARDIS) 40 MG tablet  . triamterene-hydrochlorothiazide (DYAZIDE) 37.5-25 MG per capsule   No current facility-administered medications for this encounter.  Myra Gianotti, PA-C Surgical Short Stay/Anesthesiology [facility] Phone [phone] [facility] Phone [phone] 12/23/2018 11:07 AM

## 2018-12-23 NOTE — Anesthesia Preprocedure Evaluation (Addendum)
Anesthesia Evaluation  Patient identified by MRN, date of birth, ID band Patient awake    Reviewed: Allergy & Precautions, NPO status , Patient's Chart, lab work & pertinent test results  History of Anesthesia Complications Negative for: history of anesthetic complications  Airway Mallampati: II  TM Distance: >3 FB Neck ROM: Full    Dental no notable dental hx. (+) Dental Advisory Given   Pulmonary asthma ,    Pulmonary exam normal        Cardiovascular hypertension, Pt. on medications Normal cardiovascular exam     Neuro/Psych negative neurological ROS  negative psych ROS   GI/Hepatic negative GI ROS, Neg liver ROS,   Endo/Other  Morbid obesity  Renal/GU negative Renal ROS     Musculoskeletal   Abdominal   Peds  Hematology   Anesthesia Other Findings   Reproductive/Obstetrics                           Anesthesia Physical Anesthesia Plan  ASA: III  Anesthesia Plan: General   Post-op Pain Management:  Regional for Post-op pain   Induction: Intravenous  PONV Risk Score and Plan: 3 and Ondansetron, Dexamethasone and Midazolam  Airway Management Planned: Oral ETT  Additional Equipment:   Intra-op Plan:   Post-operative Plan: Extubation in OR  Informed Consent: I have reviewed the patients History and Physical, chart, labs and discussed the procedure including the risks, benefits and alternatives for the proposed anesthesia with the patient or authorized representative who has indicated his/her understanding and acceptance.     Dental advisory given  Plan Discussed with: CRNA, Anesthesiologist and Surgeon  Anesthesia Plan Comments: (PAT note written 12/23/2018 by Myra Gianotti, PA-C. )      Anesthesia Quick Evaluation

## 2018-12-23 NOTE — Telephone Encounter (Signed)
Please advise. Thanks.  

## 2018-12-23 NOTE — Telephone Encounter (Signed)
Patient called wanting to know if Dr. Marlou Sa would go ahead and write the RX for her post surgical pain medication or is that something Dr. Marlou Sa does after the procedure.  The patient was just wanting to know because they would like to having everything ready for when she comes home for surgery and won't have to stop on the way.  CB#[phone]

## 2018-12-24 ENCOUNTER — Other Ambulatory Visit: Payer: Self-pay | Admitting: Surgical

## 2018-12-24 MED ORDER — OXYCODONE HCL 5 MG PO TABS: 5.0000 mg | ORAL_TABLET | Freq: Four times a day (QID) | ORAL | 0 refills | Status: DC | PRN

## 2018-12-24 MED ORDER — DEXTROSE 5 % IV SOLN
3.0000 g | INTRAVENOUS | Status: AC
  Administered 2018-12-25: 3 g via INTRAVENOUS
  Filled 2018-12-24: qty 3
  Filled 2018-12-24: qty 3000

## 2018-12-24 MED ORDER — METHOCARBAMOL 500 MG PO TABS: 500.0000 mg | ORAL_TABLET | Freq: Three times a day (TID) | ORAL | 0 refills | Status: DC | PRN

## 2018-12-24 MED ORDER — ASPIRIN EC 81 MG PO TBEC: 81.0000 mg | DELAYED_RELEASE_TABLET | Freq: Every day | ORAL | 0 refills | Status: DC

## 2018-12-24 NOTE — Telephone Encounter (Signed)
I sent in the pain medication and post-surgical medications to her pharmacy, she may begin taking them as directed tomorrow after surgery.

## 2018-12-24 NOTE — Telephone Encounter (Signed)
IC advised.  

## 2018-12-25 ENCOUNTER — Ambulatory Visit (HOSPITAL_COMMUNITY)
Admission: RE | Admit: 2018-12-25 | Discharge: 2018-12-25 | Disposition: A | Discharge status: Home / Self Care | Payer: BLUE CROSS/BLUE SHIELD | Attending: Orthopedic Surgery | Admitting: Orthopedic Surgery

## 2018-12-25 ENCOUNTER — Encounter (HOSPITAL_COMMUNITY): Payer: Self-pay

## 2018-12-25 ENCOUNTER — Telehealth: Payer: Self-pay | Admitting: Orthopedic Surgery

## 2018-12-25 ENCOUNTER — Other Ambulatory Visit: Payer: Self-pay

## 2018-12-25 ENCOUNTER — Ambulatory Visit (HOSPITAL_COMMUNITY): Payer: BLUE CROSS/BLUE SHIELD | Admitting: Vascular Surgery

## 2018-12-25 ENCOUNTER — Encounter (HOSPITAL_COMMUNITY)
Admission: RE | Disposition: A | Discharge status: Home / Self Care | Payer: Self-pay | Source: Home / Self Care | Attending: Orthopedic Surgery

## 2018-12-25 ENCOUNTER — Ambulatory Visit (HOSPITAL_COMMUNITY): Payer: BLUE CROSS/BLUE SHIELD | Admitting: Certified Registered Nurse Anesthetist

## 2018-12-25 DIAGNOSIS — M65811 Other synovitis and tenosynovitis, right shoulder: Secondary | ICD-10-CM | POA: Insufficient documentation

## 2018-12-25 DIAGNOSIS — M75101 Unspecified rotator cuff tear or rupture of right shoulder, not specified as traumatic: Secondary | ICD-10-CM | POA: Insufficient documentation

## 2018-12-25 DIAGNOSIS — I10 Essential (primary) hypertension: Secondary | ICD-10-CM | POA: Diagnosis not present

## 2018-12-25 DIAGNOSIS — Z793 Long term (current) use of hormonal contraceptives: Secondary | ICD-10-CM | POA: Diagnosis not present

## 2018-12-25 DIAGNOSIS — Z79899 Other long term (current) drug therapy: Secondary | ICD-10-CM | POA: Insufficient documentation

## 2018-12-25 DIAGNOSIS — Z7982 Long term (current) use of aspirin: Secondary | ICD-10-CM | POA: Insufficient documentation

## 2018-12-25 DIAGNOSIS — M7521 Bicipital tendinitis, right shoulder: Secondary | ICD-10-CM | POA: Insufficient documentation

## 2018-12-25 DIAGNOSIS — Z6841 Body Mass Index (BMI) 40.0 and over, adult: Secondary | ICD-10-CM | POA: Insufficient documentation

## 2018-12-25 DIAGNOSIS — J4520 Mild intermittent asthma, uncomplicated: Secondary | ICD-10-CM | POA: Insufficient documentation

## 2018-12-25 DIAGNOSIS — S46011D Strain of muscle(s) and tendon(s) of the rotator cuff of right shoulder, subsequent encounter: Secondary | ICD-10-CM

## 2018-12-25 DIAGNOSIS — G8918 Other acute postprocedural pain: Secondary | ICD-10-CM | POA: Diagnosis not present

## 2018-12-25 HISTORY — PX: SHOULDER ARTHROSCOPY WITH SUBACROMIAL DECOMPRESSION, ROTATOR CUFF REPAIR AND BICEP TENDON REPAIR: SHX5687

## 2018-12-25 LAB — POCT PREGNANCY, URINE: Preg Test, Ur: NEGATIVE

## 2018-12-25 SURGERY — SHOULDER ARTHROSCOPY WITH SUBACROMIAL DECOMPRESSION, ROTATOR CUFF REPAIR AND BICEP TENDON REPAIR
Anesthesia: General | Site: Shoulder | Laterality: Right

## 2018-12-25 MED ORDER — MIDAZOLAM HCL 5 MG/5ML IJ SOLN
INTRAMUSCULAR | Status: DC | PRN
  Administered 2018-12-25: 2 mg via INTRAVENOUS

## 2018-12-25 MED ORDER — EPHEDRINE SULFATE 50 MG/ML IJ SOLN
INTRAMUSCULAR | Status: DC | PRN
  Administered 2018-12-25 (×2): 5 mg via INTRAVENOUS

## 2018-12-25 MED ORDER — CHLORHEXIDINE GLUCONATE 4 % EX LIQD: 60.0000 mL | Freq: Once | CUTANEOUS | Status: DC

## 2018-12-25 MED ORDER — LIDOCAINE 2% (20 MG/ML) 5 ML SYRINGE
INTRAMUSCULAR | Status: AC
  Filled 2018-12-25: qty 5

## 2018-12-25 MED ORDER — SODIUM CHLORIDE 0.9 % IV SOLN
INTRAVENOUS | Status: DC | PRN
  Administered 2018-12-25: 08:00:00 25 ug/min via INTRAVENOUS

## 2018-12-25 MED ORDER — LACTATED RINGERS IV SOLN
INTRAVENOUS | Status: DC | PRN
  Administered 2018-12-25: 07:00:00 via INTRAVENOUS

## 2018-12-25 MED ORDER — FENTANYL CITRATE (PF) 100 MCG/2ML IJ SOLN
INTRAMUSCULAR | Status: AC
  Administered 2018-12-25: 25 ug via INTRAVENOUS
  Filled 2018-12-25: qty 2

## 2018-12-25 MED ORDER — SUCCINYLCHOLINE CHLORIDE 20 MG/ML IJ SOLN
INTRAMUSCULAR | Status: DC | PRN
  Administered 2018-12-25: 120 mg via INTRAVENOUS

## 2018-12-25 MED ORDER — STERILE WATER FOR IRRIGATION IR SOLN
Status: DC | PRN
  Administered 2018-12-25: 1000 mL

## 2018-12-25 MED ORDER — LACTATED RINGERS IV SOLN: INTRAVENOUS | Status: DC

## 2018-12-25 MED ORDER — ROCURONIUM BROMIDE 50 MG/5ML IV SOSY
PREFILLED_SYRINGE | INTRAVENOUS | Status: DC | PRN
  Administered 2018-12-25: 40 mg via INTRAVENOUS

## 2018-12-25 MED ORDER — BUPIVACAINE LIPOSOME 1.3 % IJ SUSP
INTRAMUSCULAR | Status: DC | PRN
  Administered 2018-12-25: 10 mL via PERINEURAL

## 2018-12-25 MED ORDER — DEXAMETHASONE SODIUM PHOSPHATE 10 MG/ML IJ SOLN
INTRAMUSCULAR | Status: DC | PRN
  Administered 2018-12-25: 5 mg via INTRAVENOUS

## 2018-12-25 MED ORDER — SUCCINYLCHOLINE CHLORIDE 200 MG/10ML IV SOSY
PREFILLED_SYRINGE | INTRAVENOUS | Status: AC
  Filled 2018-12-25: qty 10

## 2018-12-25 MED ORDER — ONDANSETRON HCL 4 MG/2ML IJ SOLN
INTRAMUSCULAR | Status: AC
  Filled 2018-12-25: qty 2

## 2018-12-25 MED ORDER — PROMETHAZINE HCL 25 MG/ML IJ SOLN: 6.2500 mg | INTRAMUSCULAR | Status: DC | PRN

## 2018-12-25 MED ORDER — SCOPOLAMINE 1 MG/3DAYS TD PT72
1.0000 | MEDICATED_PATCH | TRANSDERMAL | Status: DC
  Administered 2018-12-25: 1.5 mg via TRANSDERMAL
  Filled 2018-12-25: qty 1

## 2018-12-25 MED ORDER — ONDANSETRON HCL 4 MG/2ML IJ SOLN
INTRAMUSCULAR | Status: DC | PRN
  Administered 2018-12-25: 4 mg via INTRAVENOUS

## 2018-12-25 MED ORDER — FENTANYL CITRATE (PF) 100 MCG/2ML IJ SOLN
25.0000 ug | INTRAMUSCULAR | Status: DC | PRN
  Administered 2018-12-25: 12:00:00 25 ug via INTRAVENOUS

## 2018-12-25 MED ORDER — 0.9 % SODIUM CHLORIDE (POUR BTL) OPTIME
TOPICAL | Status: DC | PRN
  Administered 2018-12-25 (×7): 1000 mL

## 2018-12-25 MED ORDER — SODIUM CHLORIDE 0.9 % IR SOLN
Status: DC | PRN
  Administered 2018-12-25 (×2): 3000 mL

## 2018-12-25 MED ORDER — SUGAMMADEX SODIUM 200 MG/2ML IV SOLN
INTRAVENOUS | Status: DC | PRN
  Administered 2018-12-25: 200 mg via INTRAVENOUS

## 2018-12-25 MED ORDER — FENTANYL CITRATE (PF) 250 MCG/5ML IJ SOLN
INTRAMUSCULAR | Status: AC
  Filled 2018-12-25: qty 5

## 2018-12-25 MED ORDER — EPHEDRINE 5 MG/ML INJ
INTRAVENOUS | Status: AC
  Filled 2018-12-25: qty 10

## 2018-12-25 MED ORDER — FENTANYL CITRATE (PF) 250 MCG/5ML IJ SOLN
INTRAMUSCULAR | Status: DC | PRN
  Administered 2018-12-25: 100 ug via INTRAVENOUS

## 2018-12-25 MED ORDER — CELECOXIB 200 MG PO CAPS
400.0000 mg | ORAL_CAPSULE | Freq: Once | ORAL | Status: AC
  Administered 2018-12-25: 07:00:00 400 mg via ORAL
  Filled 2018-12-25: qty 2

## 2018-12-25 MED ORDER — PROPOFOL 10 MG/ML IV BOLUS
INTRAVENOUS | Status: DC | PRN
  Administered 2018-12-25: 200 mg via INTRAVENOUS
  Administered 2018-12-25: 30 mg via INTRAVENOUS

## 2018-12-25 MED ORDER — ACETAMINOPHEN 500 MG PO TABS
1000.0000 mg | ORAL_TABLET | Freq: Once | ORAL | Status: AC
  Administered 2018-12-25: 1000 mg via ORAL
  Filled 2018-12-25: qty 2

## 2018-12-25 MED ORDER — DEXAMETHASONE SODIUM PHOSPHATE 10 MG/ML IJ SOLN
INTRAMUSCULAR | Status: AC
  Filled 2018-12-25: qty 1

## 2018-12-25 MED ORDER — ROCURONIUM BROMIDE 10 MG/ML (PF) SYRINGE
PREFILLED_SYRINGE | INTRAVENOUS | Status: AC
  Filled 2018-12-25: qty 10

## 2018-12-25 MED ORDER — MIDAZOLAM HCL 2 MG/2ML IJ SOLN
INTRAMUSCULAR | Status: AC
  Filled 2018-12-25: qty 2

## 2018-12-25 MED ORDER — BUPIVACAINE HCL (PF) 0.25 % IJ SOLN
INTRAMUSCULAR | Status: DC | PRN
  Administered 2018-12-25: 15 mL

## 2018-12-25 MED ORDER — PROPOFOL 10 MG/ML IV BOLUS
INTRAVENOUS | Status: AC
  Filled 2018-12-25: qty 40

## 2018-12-25 SURGICAL SUPPLY — 74 items
ANCHOR FBRTK 2.6 SUTURETAP 1.3 (Anchor) ×8 IMPLANT
ANCHOR SUT 1.8 FBRTK KNTLS 2SU (Anchor) ×4 IMPLANT
ANCHOR SUT BIO SW 4.75X19.1 (Anchor) ×2 IMPLANT
BLADE EXCALIBUR 4.0MM X 13CM (MISCELLANEOUS) ×1
BLADE EXCALIBUR 4.0X13 (MISCELLANEOUS) ×2 IMPLANT
BLADE SURG 11 STRL SS (BLADE) IMPLANT
BURR OVAL 8 FLU 4.0MM X 13CM (MISCELLANEOUS)
BURR OVAL 8 FLU 4.0X13 (MISCELLANEOUS) IMPLANT
CLOSURE STERI-STRIP 1/2X4 (GAUZE/BANDAGES/DRESSINGS) ×1
CLOSURE WOUND 1/2 X4 (GAUZE/BANDAGES/DRESSINGS) ×1
CLSR STERI-STRIP ANTIMIC 1/2X4 (GAUZE/BANDAGES/DRESSINGS) ×1 IMPLANT
COVER SURGICAL LIGHT HANDLE (MISCELLANEOUS) ×3 IMPLANT
COVER WAND RF STERILE (DRAPES) IMPLANT
DRAPE INCISE IOBAN 66X45 STRL (DRAPES) ×6 IMPLANT
DRAPE STERI 35X30 U-POUCH (DRAPES) ×3 IMPLANT
DRAPE U-SHAPE 47X51 STRL (DRAPES) ×6 IMPLANT
DRSG AQUACEL AG ADV 3.5X 6 (GAUZE/BANDAGES/DRESSINGS) ×2 IMPLANT
DRSG TEGADERM 4X4.75 (GAUZE/BANDAGES/DRESSINGS) ×9 IMPLANT
DRSG XEROFORM 1X8 (GAUZE/BANDAGES/DRESSINGS) ×2 IMPLANT
DURAPREP 26ML APPLICATOR (WOUND CARE) ×3 IMPLANT
DW OUTFLOW CASSETTE/TUBE SET (MISCELLANEOUS) ×3 IMPLANT
ELECT REM PT RETURN 9FT ADLT (ELECTROSURGICAL) ×3
ELECTRODE REM PT RTRN 9FT ADLT (ELECTROSURGICAL) ×1 IMPLANT
GAUZE SPONGE 4X4 12PLY STRL (GAUZE/BANDAGES/DRESSINGS) ×3 IMPLANT
GAUZE SPONGE 4X4 12PLY STRL LF (GAUZE/BANDAGES/DRESSINGS) ×3 IMPLANT
GLOVE BIOGEL PI IND STRL 7.5 (GLOVE) ×1 IMPLANT
GLOVE BIOGEL PI IND STRL 8 (GLOVE) ×1 IMPLANT
GLOVE BIOGEL PI INDICATOR 7.5 (GLOVE) ×2
GLOVE BIOGEL PI INDICATOR 8 (GLOVE) ×2
GLOVE ECLIPSE 7.0 STRL STRAW (GLOVE) ×3 IMPLANT
GLOVE SURG ORTHO 8.0 STRL STRW (GLOVE) ×3 IMPLANT
GOWN STRL REUS W/ TWL LRG LVL3 (GOWN DISPOSABLE) ×3 IMPLANT
GOWN STRL REUS W/TWL LRG LVL3 (GOWN DISPOSABLE) ×6
KIT BASIN OR (CUSTOM PROCEDURE TRAY) ×3 IMPLANT
KIT STR SPEAR 1.8 FBRTK DISP (KITS) ×2 IMPLANT
KIT TURNOVER KIT B (KITS) ×3 IMPLANT
MANIFOLD NEPTUNE II (INSTRUMENTS) ×3 IMPLANT
NDL SCORPION MULTI FIRE (NEEDLE) IMPLANT
NDL SPNL 18GX3.5 QUINCKE PK (NEEDLE) ×1 IMPLANT
NDL SUT 6 .5 CRC .975X.05 MAYO (NEEDLE) IMPLANT
NEEDLE MAYO TAPER (NEEDLE)
NEEDLE SCORPION MULTI FIRE (NEEDLE) ×6 IMPLANT
NEEDLE SPNL 18GX3.5 QUINCKE PK (NEEDLE) ×3 IMPLANT
NS IRRIG 1000ML POUR BTL (IV SOLUTION) ×3 IMPLANT
PACK SHOULDER (CUSTOM PROCEDURE TRAY) ×3 IMPLANT
PAD ARMBOARD 7.5X6 YLW CONV (MISCELLANEOUS) ×6 IMPLANT
PORT APPOLLO RF 90DEGREE MULTI (SURGICAL WAND) ×2 IMPLANT
PUSHLOCK PEEK 4.5X24 (Orthopedic Implant) ×4 IMPLANT
RESTRAINT HEAD UNIVERSAL NS (MISCELLANEOUS) ×3 IMPLANT
SLING ARM IMMOBILIZER LRG (SOFTGOODS) ×2 IMPLANT
SPONGE LAP 18X18 X RAY DECT (DISPOSABLE) ×2 IMPLANT
SPONGE LAP 4X18 RFD (DISPOSABLE) ×6 IMPLANT
STRIP CLOSURE SKIN 1/2X4 (GAUZE/BANDAGES/DRESSINGS) ×2 IMPLANT
SUCTION FRAZIER HANDLE 10FR (MISCELLANEOUS) ×2
SUCTION TUBE FRAZIER 10FR DISP (MISCELLANEOUS) ×1 IMPLANT
SUT ETHILON 3 0 PS 1 (SUTURE) ×3 IMPLANT
SUT FIBERWIRE #2 38 T-5 BLUE (SUTURE)
SUT FIBERWIRE 2-0 18 17.9 3/8 (SUTURE) ×3
SUT MNCRL AB 3-0 PS2 18 (SUTURE) ×3 IMPLANT
SUT VIC AB 0 CT1 27 (SUTURE) ×6
SUT VIC AB 0 CT1 27XBRD ANBCTR (SUTURE) ×1 IMPLANT
SUT VIC AB 1 CT1 27 (SUTURE) ×8
SUT VIC AB 1 CT1 27XBRD ANBCTR (SUTURE) IMPLANT
SUT VIC AB 2-0 CT1 27 (SUTURE) ×8
SUT VIC AB 2-0 CT1 TAPERPNT 27 (SUTURE) ×1 IMPLANT
SUT VICRYL 0 UR6 27IN ABS (SUTURE) ×11 IMPLANT
SUTURE FIBERWR #2 38 T-5 BLUE (SUTURE) IMPLANT
SUTURE FIBERWR 2-0 18 17.9 3/8 (SUTURE) IMPLANT
SUTURE TAPE 1.3 FIBERLOP 20 ST (SUTURE) IMPLANT
SUTURETAPE 1.3 FIBERLOOP 20 ST (SUTURE)
TOWEL GREEN STERILE (TOWEL DISPOSABLE) ×3 IMPLANT
TOWEL GREEN STERILE FF (TOWEL DISPOSABLE) ×3 IMPLANT
TUBING ARTHROSCOPY IRRIG 16FT (MISCELLANEOUS) ×3 IMPLANT
WATER STERILE IRR 1000ML POUR (IV SOLUTION) ×3 IMPLANT

## 2018-12-25 NOTE — Anesthesia Postprocedure Evaluation (Signed)
Anesthesia Post Note  Patient: Ann Morgan  Procedure(s) Performed: right shoulder arthroscopy, biceps tendon release with tenodesis, three tendon rotator cuff repair (Right Shoulder)     Patient location during evaluation: PACU Anesthesia Type: General Level of consciousness: sedated Pain management: pain level controlled Vital Signs Assessment: post-procedure vital signs reviewed and stable Respiratory status: spontaneous breathing and respiratory function stable Cardiovascular status: stable Postop Assessment: no apparent nausea or vomiting Anesthetic complications: no    Last Vitals:  Vitals:   12/25/18 1200 12/25/18 1203  BP: 110/78 110/78  Pulse: 62 62  Resp: 20 19  Temp: 36.5 C   SpO2: 95% 99%    Last Pain:  Vitals:   12/25/18 1203  PainSc: 3                  Cristiana Yochim DANIEL

## 2018-12-25 NOTE — Brief Op Note (Signed)
1  12/25/2018  10:56 AM  PATIENT:  Ann Morgan  48 y.o. female  PRE-OPERATIVE DIAGNOSIS:  massive 3 tendon rotator cuff tear  POST-OPERATIVE DIAGNOSIS:  massive 3 tendon rotator cuff tear  PROCEDURE:  Procedure(s): right shoulder arthroscopy, biceps tendon release with tenodesis, three tendon rotator cuff repair  SURGEON:  Surgeon(s): Marlou Sa, Tonna Corner, MD  ASSISTANT: magnaNT PA  ANESTHESIA:   general  EBL: 50 ml    Total I/O In: -  Out: 50 [Blood:50]  BLOOD ADMINISTERED: none  DRAINS: none   LOCAL MEDICATIONS USED:  none  SPECIMEN:  No Specimen  COUNTS:  YES  TOURNIQUET:  * No tourniquets in log *  DICTATION: .Other Dictation: Dictation Number [id number]  PLAN OF CARE: Discharge to home after PACU  PATIENT DISPOSITION:  PACU - hemodynamically stable

## 2018-12-25 NOTE — Op Note (Signed)
NAME: Ann Morgan, MONTELLO MEDICAL RECORD [id number] ACCOUNT [id number] DATE OF BIRTH:03/15/1971 FACILITY: [facility] LOCATION: [facility] PHYSICIAN:Obrian Bulson Randel Pigg, MD  OPERATIVE REPORT  DATE OF PROCEDURE:  12/25/2018  PREOPERATIVE DIAGNOSIS:  Right shoulder massive rotator cuff tear, biceps tendinitis.  POSTOPERATIVE DIAGNOSIS:  Right shoulder massive rotator cuff tear, biceps tendinitis.  PROCEDURE:  Right shoulder arthroscopy with biceps tendon release, debridement of the superior labrum, mini open biceps tenodesis using Arthrex anchors as well as massive 3 tendon rotator cuff tear repair of the infraspinatus, supraspinatus and upper  portion of the subscap also using Arthrex anchors.  SURGEON:  Meredith Pel, MD  ASSISTANT:  Annie Main, PA.  INDICATIONS:  This is a 48 year old patient with right shoulder pain.  She presents for operative management after explanation of risks and benefits.  OPERATIVE FINDINGS: 1.  Examination under anesthesia.  The patient had good external rotation at 15 degrees of abduction to about 70 degrees.  The patient also had full forward flexion to 170 and full isolated glenohumeral abduction to about 100. 2.  Diagnostic arthroscopy. A.  Biceps tendinitis with tearing of the superior labrum. B.  Retracted tears of the infraspinatus and supraspinatus to the top of the humeral head. C.  Tear of the superior portion of the subscapularis with some instability of the biceps tendon.  PROCEDURE IN DETAIL:  The patient was brought to the operating room where general anesthetic was induced.  Preoperative antibiotics administered.  Timeout was called.  The patient was placed in the beach chair position with the head in neutral position.   Right arm prescrubbed with alcohol and Betadine, allowed to air dry.  Prep with DuraPrep solution and draped in a sterile manner.  Ioban used to cover the axilla and then used to cover the entire operative field after the  arthroscopy.  A posterior  portal created 2 cm x 2 cm medial and inferior to the posterolateral margin of the acromion.  Anterior portal created under direct visualization.  Diagnostic arthroscopy was performed.  The patient did have massive retracted tears of the infraspinatus  and supraspinatus.  Upper portion of the subscapularis was also involved.  Some synovitis was present within the rotator interval.  The biceps tendon was released.  Superior labrum debrided.  The instruments were removed and the portals were then closed  using 3-0 nylon.  Ioban then used to cover the entire operative field.  An anterolateral approach was used off the anterolateral margin of the acromion.  Skin and subcutaneous tissue were sharply divided.  Raphae between the anterior and middle raphae  was developed and a stay suture placed 4 cm from the anterolateral margin of the acromion.  At this time, bursectomy was performed.  The patient had relatively good subacromial space without spurring.  CA ligament was left intact.  Attention was then  directed towards the biceps tenodesis.  Using 2 Arthrex knotless SutureTaks, biceps tendon was tenodesed under appropriate tension into the bicipital groove.  Another knotless SutureTak was used to repair the upper portion of the subscap.  This tear  involved about 1 cm.  Minimal retraction in the inferior portion of the subscapularis was intact.  At this time, a rotator interval was released to the base of the coracoid.  The coracohumeral ligament also released.  Mobilization of the tendon after  placing four 0 Vicryl stay sutures was performed with Cobb elevator.  These maneuvers allowed mobilization of the tendon from the top of the humeral head to just lateral to  the articular surface and onto the footprint.  After mobilization was performed,  the 3 Arthrex SutureTaks were placed with good fixation achieved and very good bone.  This was placed at the articular margin footprint  interface.  The Scorpion suture passer was then used to pass all twelve limbs of the FiberTape from the 3 SutureTaks  into the tendon.  Then, pulling on the traction sutures, these 6 knots were tied from posterior to anterior.  The suture limbs were then crossed and taken down over the tendon repair site on to the tuberosity footprint of the rotator cuff for healing.   This was done while traction was placed on the rotator cuff stay sutures.  All in all, a near watertight repair was achieved.  The 2 suture tapes were tacked down using an Arthrex PushLock and the Vicryl sutures on the ends of the tendon, which traction  sutures were tacked down using a SwiveLock in between the 2 PushLocks.  Rotator interval was then closed with the arm in about 50 degrees of external rotation.  This gave a very nice solid repair.  Thorough irrigation was performed both before and after  the rotator cuff repair.  At this time, the deltoid split was repaired using #1 Vicryl suture followed by interrupted inverted 0 Vicryl suture, 2-0 Vicryl suture and a 3-0 Monocryl.  Aquacel dressing placed over the incisions.  The patient was placed in  a shoulder sling.  She tolerated the procedure well without immediate complications.  She was transferred to the recovery room in stable condition.  Luke's assistance was required at all times for retraction.  His assistance was of medical necessity for  opening, closing and retraction.  TN/NUANCE  D:12/25/2018 T:12/25/2018 JOB:[id number]

## 2018-12-25 NOTE — Anesthesia Procedure Notes (Signed)
Procedure Name: Intubation Date/Time: 12/25/2018 7:35 AM Performed by: Glynda Jaeger, CRNA Pre-anesthesia Checklist: Patient identified, Patient being monitored, Timeout performed, Emergency Drugs available and Suction available Patient Re-evaluated:Patient Re-evaluated prior to induction Oxygen Delivery Method: Circle System Utilized Preoxygenation: Pre-oxygenation with 100% oxygen Induction Type: IV induction Ventilation: Mask ventilation without difficulty Laryngoscope Size: Mac and 4 Grade View: Grade I Tube type: Oral Tube size: 7.5 mm Number of attempts: 1 Airway Equipment and Method: Stylet Placement Confirmation: ETT inserted through vocal cords under direct vision,  positive ETCO2 and breath sounds checked- equal and bilateral Secured at: 21 cm Tube secured with: Tape Dental Injury: Teeth and Oropharynx as per pre-operative assessment
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
## 2018-12-25 NOTE — Transfer of Care (Signed)
Immediate Anesthesia Transfer of Care Note  Patient: Ann Morgan  Procedure(s) Performed: right shoulder arthroscopy, biceps tendon release with tenodesis, three tendon rotator cuff repair (Right Shoulder)  Patient Location: PACU  Anesthesia Type:GA combined with regional for post-op pain  Level of Consciousness: awake, oriented, drowsy and patient cooperative  Airway & Oxygen Therapy: Patient Spontanous Breathing and Patient connected to face mask oxygen  Post-op Assessment: Report given to RN, Post -op Vital signs reviewed and stable and Patient moving all extremities X 4  Post vital signs: Reviewed and stable  Last Vitals:  Vitals Value Taken Time  BP    Temp    Pulse 77 12/25/18 1109  Resp 17 12/25/18 1109  SpO2 100 % 12/25/18 1109  Vitals shown include unvalidated device data.  Last Pain:  Vitals:   12/25/18 0630  PainSc: 0-No pain         Complications: No apparent anesthesia complications

## 2018-12-25 NOTE — Telephone Encounter (Signed)
Please see if you can get [employer] to look into this. Thanks.

## 2018-12-25 NOTE — Telephone Encounter (Signed)
Patient's husband Vicente Serene called advised patient do not have the CPM machine yet. Curtis asked for a call back advising when to expect to get the CPM machine delivered to them. The number to contact Vicente Serene is [phone]

## 2018-12-25 NOTE — Anesthesia Procedure Notes (Signed)
Anesthesia Regional Block: Interscalene brachial plexus block   Pre-Anesthetic Checklist: ,, timeout performed, Correct Patient, Correct Site, Correct Laterality, Correct Procedure, Correct Position, site marked, Risks and benefits discussed,  Surgical consent,  Pre-op evaluation,  At surgeon's request and post-op pain management  Laterality: Right  Prep: chloraprep       Needles:  Injection technique: Single-shot  Needle Type: Echogenic Stimulator Needle     Needle Length: 5cm  Needle Gauge: 22     Additional Needles:   Narrative:  Start time: 12/25/2018 7:06 AM End time: 12/25/2018 7:14 AM Injection made incrementally with aspirations every 5 mL.  Performed by: Personally  Anesthesiologist: Duane Boston, MD  Additional Notes: Functioning IV was confirmed and monitors applied.  A 23mm 22ga echogenic arrow stimulator was used. Sterile prep and drape,hand hygiene and sterile gloves were used.Ultrasound guidance: relevant anatomy identified, needle position confirmed, local anesthetic spread visualized around nerve(s)., vascular puncture avoided.  Image printed for medical record.  Negative aspiration and negative test dose prior to incremental administration of local anesthetic. The patient tolerated the procedure well.

## 2018-12-26 DIAGNOSIS — M75101 Unspecified rotator cuff tear or rupture of right shoulder, not specified as traumatic: Secondary | ICD-10-CM | POA: Diagnosis not present

## 2018-12-29 ENCOUNTER — Telehealth: Payer: Self-pay

## 2018-12-29 ENCOUNTER — Telehealth: Payer: Self-pay | Admitting: Orthopedic Surgery

## 2018-12-29 NOTE — Telephone Encounter (Signed)
I think this is likely coming from the block.  In terms of the amount of CPM I would just do 1 hour 3 times a day not 1-1/2.  I think the numbness in some of the coldness in the hand is likely from that interscalene block and that should resolve but it may take a few more days or weeks.  We can see her this week if needed.  I do not think the hand coldness and numbness is really related to anything we did with the rotator cuff.

## 2018-12-29 NOTE — Telephone Encounter (Signed)
Patient called. Would like to speak with someone regarding the protocol. Her call back number is [phone].

## 2018-12-29 NOTE — Telephone Encounter (Signed)
I called patient and advised. 

## 2018-12-29 NOTE — Telephone Encounter (Signed)
Patient called back wanting to know is it normal to have numbness/tingling in her right thumb and for her right arm and fingers to get cold when using the CPM machine?  Patient had right shoulder surgery on 12/25/2018.  Cb# is [id number].  Please advise.  Thank you.

## 2018-12-29 NOTE — Telephone Encounter (Signed)
I spoke with patient. She states that the paperwork that she was given from the hospital in regards to the CPM stated that she should use it for one hour at a time three times a day.  When the equipment was brought to her home, she was told one and a half hours three times a day. She has been doing the one and a half hours, but wants to clarify instructions.  Patient also notices while using the CPM her right forearm to her fingers go cold and she has a hard time getting them warm again. She even uses a sock on her right hand, but this does not help all of the time. She continues to have numbness and tingling in her right thumb. She would like to know if this is normal?  Please advise.

## 2018-12-29 NOTE — Telephone Encounter (Signed)
I have spoken with patient. Duplicate message has been sent to Dr. Marlou Sa to advise.

## 2018-12-30 ENCOUNTER — Encounter (HOSPITAL_COMMUNITY): Payer: Self-pay | Admitting: Orthopedic Surgery

## 2019-01-02 ENCOUNTER — Encounter: Payer: Self-pay | Admitting: Orthopedic Surgery

## 2019-01-02 ENCOUNTER — Other Ambulatory Visit: Payer: Self-pay

## 2019-01-02 ENCOUNTER — Ambulatory Visit (INDEPENDENT_AMBULATORY_CARE_PROVIDER_SITE_OTHER): Payer: BLUE CROSS/BLUE SHIELD | Admitting: Orthopedic Surgery

## 2019-01-02 VITALS — Ht 69.0 in | Wt 287.0 lb

## 2019-01-02 DIAGNOSIS — S46011D Strain of muscle(s) and tendon(s) of the rotator cuff of right shoulder, subsequent encounter: Secondary | ICD-10-CM

## 2019-01-02 NOTE — Progress Notes (Signed)
Post-Op Visit Note   Patient: Ann Morgan           Date of Birth: 1971/03/03           MRN: [id number] Visit Date: 01/02/2019 PCP: Hubbard Hartshorn, FNP   Assessment & Plan:  Chief Complaint:  Chief Complaint  Patient presents with  . Right Shoulder - Routine Post Op    12/25/2018 Right Shoulder Arthroscopy, Biceps Tendon Release With Tenodesis, Three Tendon RCR   Visit Diagnoses:  1. Traumatic complete tear of right rotator cuff, subsequent encounter     Plan: Patient is a 48 year old female who presents s/p right shoulder arthroscopy with biceps tenodesis and 3 tendon rotator cuff repair on 12/25/2018.  Patient notes that she has been feeling good since surgery and denies any pain, only discomfort.  She is not taking any pain medication.  She has been compliant with wearing the sling all the time.  She is in the CPM machine for 1 hour 3 times a day and has reached 75 degrees on the machine.  Her only complaint is occasional cold feelings in her distal forearm and right hand when in the CPM machine for the last session that day.  She has been using an ice machine afterward to help with discomfort.  She has no passive grinding on exam and decent strength for her recovery timeline.  Sutures removed and incisions are healing well.  Patient will stay in the sling and continue using the CPM machine for the next 2 weeks and follow-up in 2 weeks.  Follow-Up Instructions: No follow-ups on file.   Orders:  No orders of the defined types were placed in this encounter.  No orders of the defined types were placed in this encounter.   Imaging: No results found.  PMFS History: Patient Active Problem List   Diagnosis Date Noted  . Endometrial hyperplasia without atypia, simple 05/17/2017  . Allergic rhinitis 03/07/2017  . Patellofemoral pain syndrome of both knees 02/04/2017  . Glucose intolerance 01/01/2017  . Overactive bladder 01/01/2017  . Body mass index (BMI) of 45.0-49.9 in  adult ([place]) 09/19/2015  . Breast cyst 12/11/2012  . Abnormal mammogram 06/09/2012  . Polycystic ovaries 03/29/2010   Past Medical History:  Diagnosis Date  . Abnormal Pap smear of cervix   . Asthma   . Endometrial hyperplasia   . Hypertension 2004  . Irregular menses   . Mild intermittent asthma with acute exacerbation 10/19/2013   Late 2013 with a cough that lasted approximately one month.  PFTs showed FEV1 of 83%  Last Assessment & Plan:  Reports that since she was prescribed albuterol, she has used it maybe 1x to see how she would respond to it which seemed to help. However, she still has some coughing issues but thinks it is manageable. No issues with high pollen season.   Plan: - Continue with albuterol PRN.  - She will  . Obesity     Family History  Problem Relation Age of Onset  . Hypertension Mother   . Diabetes Brother   . Hypertension Brother   . Breast cancer Maternal Grandmother 54    Past Surgical History:  Procedure Laterality Date  . BREAST CYST ASPIRATION Left 2015   benign  . KNEE SURGERY Left jan 2011   orthoscopic  . SHOULDER ARTHROSCOPY WITH SUBACROMIAL DECOMPRESSION, ROTATOR CUFF REPAIR AND BICEP TENDON REPAIR Right 12/25/2018   Procedure: right shoulder arthroscopy, biceps tendon release with tenodesis, three tendon rotator cuff repair;  Surgeon: Meredith Pel, MD;  Location: [facility];  Service: Orthopedics;  Laterality: Right;   Social History   Occupational History  . Not on file  Tobacco Use  . Smoking status: Never Smoker  . Smokeless tobacco: Never Used  Substance and Sexual Activity  . Alcohol use: No  . Drug use: No  . Sexual activity: Yes    Partners: Male    Birth control/protection: None

## 2019-01-05 ENCOUNTER — Telehealth: Payer: Self-pay | Admitting: Orthopedic Surgery

## 2019-01-05 NOTE — Telephone Encounter (Signed)
I would go to 100 degrees and then call it after that.  Probably would not get a 120 with her tear.

## 2019-01-05 NOTE — Telephone Encounter (Signed)
Please advise. Thanks.  

## 2019-01-05 NOTE — Telephone Encounter (Signed)
Patient called left voicemail message stating she is at 93% on the CPM machine and med equipment asked her to contact Dr Marlou Sa to make him aware. Patient asked if she should continue using the CPM machine until she reach 120%? Patient said she is not in a lot of pain but, she is feeling a stretch and pulling in the shoulder area. Patient said she can not sit upright in the chair. The number to contact patient is [phone] OR [phone]

## 2019-01-05 NOTE — Telephone Encounter (Signed)
IC s/w patient and advised  

## 2019-01-16 DIAGNOSIS — M75101 Unspecified rotator cuff tear or rupture of right shoulder, not specified as traumatic: Secondary | ICD-10-CM | POA: Diagnosis not present

## 2019-01-19 ENCOUNTER — Telehealth: Payer: Self-pay

## 2019-01-19 ENCOUNTER — Encounter: Payer: Self-pay | Admitting: Orthopedic Surgery

## 2019-01-19 ENCOUNTER — Ambulatory Visit (INDEPENDENT_AMBULATORY_CARE_PROVIDER_SITE_OTHER): Payer: BLUE CROSS/BLUE SHIELD | Admitting: Orthopedic Surgery

## 2019-01-19 ENCOUNTER — Other Ambulatory Visit: Payer: Self-pay

## 2019-01-19 ENCOUNTER — Telehealth: Payer: Self-pay | Admitting: Family Medicine

## 2019-01-19 ENCOUNTER — Telehealth: Payer: Self-pay | Admitting: Emergency Medicine

## 2019-01-19 VITALS — Ht 69.0 in | Wt 287.0 lb

## 2019-01-19 DIAGNOSIS — S46011A Strain of muscle(s) and tendon(s) of the rotator cuff of right shoulder, initial encounter: Secondary | ICD-10-CM

## 2019-01-19 MED ORDER — TRIAMTERENE-HCTZ 37.5-25 MG PO CAPS: 1.0000 | ORAL_CAPSULE | ORAL | 0 refills | Status: DC

## 2019-01-19 NOTE — Telephone Encounter (Signed)
Please schedule patient for follow up in the next 2 months.

## 2019-01-19 NOTE — Telephone Encounter (Signed)
Patients insurance will likely deny her CPM. She would like for you to write her a note stating why its medically necessary for the CPM so she can have it in case she needs to appeal it.

## 2019-01-19 NOTE — Telephone Encounter (Signed)
Pt has an appt on 01/19/2019

## 2019-01-19 NOTE — Telephone Encounter (Signed)
Please ask patient to make a virtual appointment to see Raquel Sarna. It has been over 3 months since seen

## 2019-01-19 NOTE — Telephone Encounter (Signed)
Pt scheduled virtual appt with Raquel Sarna for this Wednesday 11.4.2020

## 2019-01-19 NOTE — Telephone Encounter (Signed)
Pt request refill   telmisartan (MICARDIS) 40 MG tablet  triamterene-hydrochlorothiazide (DYAZIDE) 37.5-25 MG per capsule   90 day  Raquel Sarna has never filled for the pt.  [id number] - [address], [place] [phone] (Phone) [phone] (Fax)

## 2019-01-20 ENCOUNTER — Telehealth: Payer: Self-pay | Admitting: Orthopedic Surgery

## 2019-01-20 NOTE — Telephone Encounter (Signed)
Patient called asked for a call back for clarification on the amount of time she will be out of work. Patient asked if Dr Marlou Sa meant she will be out 2 1/2 week post op or 2 1/2 weeks in  addition to her surgery 12/25/2018. Patient said she will start (PT) 23rd and 25th working half days before returning full time 02/16/2019. Patient asked for clarification before Dr dean writes the letter. The number to contact patient is [phone]

## 2019-01-20 NOTE — Telephone Encounter (Signed)
Fine thx

## 2019-01-20 NOTE — Telephone Encounter (Signed)
[place] for this? It extends her 1/2 days. Longer than 1 week

## 2019-01-20 NOTE — Telephone Encounter (Signed)
IC advised done 

## 2019-01-20 NOTE — Telephone Encounter (Signed)
Pt called in regarding her return to work note that dr.dean provided her yesterday, pt is requesting there be specific return dates. Pt would like her return to work date be 11/25 and half days for the 1st week and resume full work duties as of 12/7.  [phone]

## 2019-01-21 ENCOUNTER — Encounter: Payer: Self-pay | Admitting: Orthopedic Surgery

## 2019-01-21 ENCOUNTER — Ambulatory Visit (INDEPENDENT_AMBULATORY_CARE_PROVIDER_SITE_OTHER): Payer: BLUE CROSS/BLUE SHIELD | Admitting: Family Medicine

## 2019-01-21 ENCOUNTER — Encounter: Payer: Self-pay | Admitting: Family Medicine

## 2019-01-21 ENCOUNTER — Other Ambulatory Visit: Payer: Self-pay

## 2019-01-21 DIAGNOSIS — I10 Essential (primary) hypertension: Secondary | ICD-10-CM | POA: Diagnosis not present

## 2019-01-21 DIAGNOSIS — N8501 Benign endometrial hyperplasia: Secondary | ICD-10-CM

## 2019-01-21 DIAGNOSIS — E282 Polycystic ovarian syndrome: Secondary | ICD-10-CM

## 2019-01-21 DIAGNOSIS — Z6841 Body Mass Index (BMI) 40.0 and over, adult: Secondary | ICD-10-CM

## 2019-01-21 DIAGNOSIS — Z9889 Other specified postprocedural states: Secondary | ICD-10-CM

## 2019-01-21 MED ORDER — TRIAMTERENE-HCTZ 37.5-25 MG PO CAPS: 1.0000 | ORAL_CAPSULE | ORAL | 1 refills | Status: DC

## 2019-01-21 MED ORDER — TELMISARTAN 40 MG PO TABS: 20.0000 mg | ORAL_TABLET | Freq: Every day | ORAL | 1 refills | Status: DC

## 2019-01-21 NOTE — Telephone Encounter (Signed)
That medical necessity is dictated in her last clinic note so you can send that to her she needs it thanks

## 2019-01-21 NOTE — Telephone Encounter (Signed)
I printed for patient.
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
## 2019-01-21 NOTE — Progress Notes (Addendum)
Post-Op Visit Note   Patient: Ann Morgan           Date of Birth: [birth year]           MRN: [id number] Visit Date: 01/19/2019 PCP: Hubbard Hartshorn, FNP   Assessment & Plan:  Chief Complaint:  Chief Complaint  Patient presents with  . Right Shoulder - Routine Post Op    Right shoulder scope DOS 12/25/2018   Visit Diagnoses:  1. Traumatic complete tear of right rotator cuff, initial encounter     Plan: Patient is now about a month out from right shoulder arthroscopy major rotator cuff tendon repair.  She is in CPM machine 3 times a day.  She is at 100.  She actually lost her footing in the shower and use the right arm to brace herself.  On exam she has good passive range of motion.  Not much in the way of coarse grinding or crepitus.  She is predictably weak but I think that will improve.  I want her to start physical therapy for strengthening in 2 weeks.  Okay for her to go on the CPM machine past 100 at this time.  It does not look like that fall in the shower injured the repair.  Copy of op note is sent with her to work with physical therapy.  Come back in 4 weeks for clinical recheck.  I do think it is okay for her to come out of the sling now but no lifting with that right arm.  This was a massive rotator cuff tear repair.  The CPM machine which the patient use was a medical necessity for her case.  She has a massive rotator cuff tear which was repairable.  6 weeks of immobilization would have given her a predictably very stiff shoulder.  Too much active range of motion would have put the repair under jeopardy.  Therefore the CPM machine which provided for passive range of motion which protected the repair but also prevented shoulder stiffness was a medical necessity for her at this time.  Follow-Up Instructions: Return in about 4 weeks (around 02/16/2019).   Orders:  No orders of the defined types were placed in this encounter.  No orders of the defined types were placed in  this encounter.   Imaging: No results found.  PMFS History: Patient Active Problem List   Diagnosis Date Noted  . Endometrial hyperplasia without atypia, simple 05/17/2017  . Allergic rhinitis 03/07/2017  . Patellofemoral pain syndrome of both knees 02/04/2017  . Glucose intolerance 01/01/2017  . Overactive bladder 01/01/2017  . Body mass index (BMI) of 45.0-49.9 in adult ([place]) 09/19/2015  . Breast cyst 12/11/2012  . Abnormal mammogram 06/09/2012  . Polycystic ovaries 03/29/2010   Past Medical History:  Diagnosis Date  . Abnormal Pap smear of cervix   . Asthma   . Endometrial hyperplasia   . Hypertension 2004  . Irregular menses   . Mild intermittent asthma with acute exacerbation 10/19/2013   Late 2013 with a cough that lasted approximately one month.  PFTs showed FEV1 of 83%  Last Assessment & Plan:  Reports that since she was prescribed albuterol, she has used it maybe 1x to see how she would respond to it which seemed to help. However, she still has some coughing issues but thinks it is manageable. No issues with high pollen season.   Plan: - Continue with albuterol PRN.  - She will  . Obesity     Family  History  Problem Relation Age of Onset  . Hypertension Mother   . Diabetes Brother   . Hypertension Brother   . Breast cancer Maternal Grandmother 44    Past Surgical History:  Procedure Laterality Date  . BREAST CYST ASPIRATION Left 2015   benign  . KNEE SURGERY Left jan 2011   orthoscopic  . SHOULDER ARTHROSCOPY WITH SUBACROMIAL DECOMPRESSION, ROTATOR CUFF REPAIR AND BICEP TENDON REPAIR Right 12/25/2018   Procedure: right shoulder arthroscopy, biceps tendon release with tenodesis, three tendon rotator cuff repair;  Surgeon: Meredith Pel, MD;  Location: [place];  Service: Orthopedics;  Laterality: Right;   Social History   Occupational History  . Not on file  Tobacco Use  . Smoking status: Never Smoker  . Smokeless tobacco: Never Used  Substance and Sexual  Activity  . Alcohol use: No  . Drug use: No  . Sexual activity: Yes    Partners: Male    Birth control/protection: None

## 2019-01-22 NOTE — Telephone Encounter (Signed)
y

## 2019-01-28 NOTE — Telephone Encounter (Signed)
DC CPM once therapy starts.  Thanks

## 2019-02-06 ENCOUNTER — Ambulatory Visit: Payer: BLUE CROSS/BLUE SHIELD

## 2019-02-06 ENCOUNTER — Other Ambulatory Visit: Payer: Self-pay

## 2019-02-06 ENCOUNTER — Ambulatory Visit: Payer: BLUE CROSS/BLUE SHIELD | Admitting: Emergency Medicine

## 2019-02-06 VITALS — BP 112/68 | HR 78

## 2019-02-06 DIAGNOSIS — I10 Essential (primary) hypertension: Secondary | ICD-10-CM

## 2019-02-09 ENCOUNTER — Ambulatory Visit: Payer: BLUE CROSS/BLUE SHIELD

## 2019-02-09 DIAGNOSIS — M75121 Complete rotator cuff tear or rupture of right shoulder, not specified as traumatic: Secondary | ICD-10-CM | POA: Diagnosis not present

## 2019-02-11 DIAGNOSIS — M75121 Complete rotator cuff tear or rupture of right shoulder, not specified as traumatic: Secondary | ICD-10-CM | POA: Diagnosis not present

## 2019-02-13 ENCOUNTER — Ambulatory Visit: Payer: BLUE CROSS/BLUE SHIELD | Admitting: Emergency Medicine

## 2019-02-13 ENCOUNTER — Other Ambulatory Visit: Payer: Self-pay

## 2019-02-13 VITALS — BP 126/74 | HR 67 | Resp 16

## 2019-02-13 DIAGNOSIS — I10 Essential (primary) hypertension: Secondary | ICD-10-CM

## 2019-02-16 ENCOUNTER — Encounter: Payer: Self-pay | Admitting: Orthopedic Surgery

## 2019-02-16 ENCOUNTER — Ambulatory Visit (INDEPENDENT_AMBULATORY_CARE_PROVIDER_SITE_OTHER): Payer: BLUE CROSS/BLUE SHIELD | Admitting: Orthopedic Surgery

## 2019-02-16 ENCOUNTER — Other Ambulatory Visit: Payer: Self-pay

## 2019-02-16 DIAGNOSIS — S46011A Strain of muscle(s) and tendon(s) of the rotator cuff of right shoulder, initial encounter: Secondary | ICD-10-CM

## 2019-02-16 DIAGNOSIS — M75121 Complete rotator cuff tear or rupture of right shoulder, not specified as traumatic: Secondary | ICD-10-CM | POA: Diagnosis not present

## 2019-02-16 NOTE — Progress Notes (Signed)
Post-Op Visit Note   Patient: Ann Morgan           Date of Birth: Aug 07, 1970           MRN: [id number] Visit Date: 02/16/2019 PCP: Ann Hartshorn, FNP   Assessment & Plan:  Chief Complaint:  Chief Complaint  Patient presents with  . Right Shoulder - Follow-up   Visit Diagnoses: No diagnosis found.  Plan: Ann Morgan is a patient who is now about 7 weeks out from massive rotator cuff repair on the right-hand side.  To start therapy last week.  Stop the CPM last week also when she was at 115 degrees.  Back to work half days using the mouse.  On exam she has pretty reasonable passive range of motion with external rotation to about 25 degrees.  Range of motion feels fairly smooth.  No coarse grinding or crepitus.  External rotation strength is actually about 4+ out of 5 which is very good for this tear.  Plan is to continue with physical therapy for home exercises to work really on passive range of motion and active assisted range of motion for the next 4 to 5 weeks.  Come back first week in January for clinical recheck and likely transitioning more to strengthening at that time.  Follow-Up Instructions: No follow-ups on file.   Orders:  No orders of the defined types were placed in this encounter.  No orders of the defined types were placed in this encounter.   Imaging: No results found.  PMFS History: Patient Active Problem List   Diagnosis Date Noted  . Endometrial hyperplasia without atypia, simple 05/17/2017  . Allergic rhinitis 03/07/2017  . Patellofemoral pain syndrome of both knees 02/04/2017  . Glucose intolerance 01/01/2017  . Overactive bladder 01/01/2017  . Body mass index (BMI) of 45.0-49.9 in adult ([place]) 09/19/2015  . Breast cyst 12/11/2012  . Abnormal mammogram 06/09/2012  . Polycystic ovaries 03/29/2010   Past Medical History:  Diagnosis Date  . Abnormal Pap smear of cervix   . Asthma   . Endometrial hyperplasia   . Hypertension 2004  . Irregular  menses   . Mild intermittent asthma with acute exacerbation 10/19/2013   Late 2013 with a cough that lasted approximately one month.  PFTs showed FEV1 of 83%  Last Assessment & Plan:  Reports that since she was prescribed albuterol, she has used it maybe 1x to see how she would respond to it which seemed to help. However, she still has some coughing issues but thinks it is manageable. No issues with high pollen season.   Plan: - Continue with albuterol PRN.  - She will  . Obesity     Family History  Problem Relation Age of Onset  . Hypertension Mother   . Diabetes Brother   . Hypertension Brother   . Breast cancer Maternal Grandmother 47    Past Surgical History:  Procedure Laterality Date  . BREAST CYST ASPIRATION Left 2015   benign  . KNEE SURGERY Left jan 2011   orthoscopic  . SHOULDER ARTHROSCOPY WITH SUBACROMIAL DECOMPRESSION, ROTATOR CUFF REPAIR AND BICEP TENDON REPAIR Right 12/25/2018   Procedure: right shoulder arthroscopy, biceps tendon release with tenodesis, three tendon rotator cuff repair;  Surgeon: Meredith Pel, MD;  Location: [place];  Service: Orthopedics;  Laterality: Right;   Social History   Occupational History  . Not on file  Tobacco Use  . Smoking status: Never Smoker  . Smokeless tobacco: Never Used  Substance  and Sexual Activity  . Alcohol use: No  . Drug use: No  . Sexual activity: Yes    Partners: Male    Birth control/protection: None

## 2019-02-18 DIAGNOSIS — M75121 Complete rotator cuff tear or rupture of right shoulder, not specified as traumatic: Secondary | ICD-10-CM | POA: Diagnosis not present

## 2019-02-23 DIAGNOSIS — M75121 Complete rotator cuff tear or rupture of right shoulder, not specified as traumatic: Secondary | ICD-10-CM | POA: Diagnosis not present

## 2019-02-25 DIAGNOSIS — M75121 Complete rotator cuff tear or rupture of right shoulder, not specified as traumatic: Secondary | ICD-10-CM | POA: Diagnosis not present

## 2019-02-26 ENCOUNTER — Other Ambulatory Visit: Payer: Self-pay | Admitting: Obstetrics and Gynecology

## 2019-02-26 DIAGNOSIS — Z1231 Encounter for screening mammogram for malignant neoplasm of breast: Secondary | ICD-10-CM

## 2019-03-02 ENCOUNTER — Encounter: Payer: Self-pay | Admitting: Orthopedic Surgery

## 2019-03-02 DIAGNOSIS — M75121 Complete rotator cuff tear or rupture of right shoulder, not specified as traumatic: Secondary | ICD-10-CM | POA: Diagnosis not present

## 2019-03-02 NOTE — Telephone Encounter (Signed)
I would continue with passive range of motion only.  Hard to say sometimes exactly what goes on in that postop shoulder it is longer doing passive range of motion unlikely that anything has been retorn.

## 2019-03-04 DIAGNOSIS — M75121 Complete rotator cuff tear or rupture of right shoulder, not specified as traumatic: Secondary | ICD-10-CM | POA: Diagnosis not present

## 2019-03-09 DIAGNOSIS — M75121 Complete rotator cuff tear or rupture of right shoulder, not specified as traumatic: Secondary | ICD-10-CM | POA: Diagnosis not present

## 2019-03-11 DIAGNOSIS — M75121 Complete rotator cuff tear or rupture of right shoulder, not specified as traumatic: Secondary | ICD-10-CM | POA: Diagnosis not present

## 2019-03-16 DIAGNOSIS — M75121 Complete rotator cuff tear or rupture of right shoulder, not specified as traumatic: Secondary | ICD-10-CM | POA: Diagnosis not present

## 2019-03-18 DIAGNOSIS — M75121 Complete rotator cuff tear or rupture of right shoulder, not specified as traumatic: Secondary | ICD-10-CM | POA: Diagnosis not present

## 2019-03-23 DIAGNOSIS — M75121 Complete rotator cuff tear or rupture of right shoulder, not specified as traumatic: Secondary | ICD-10-CM | POA: Diagnosis not present

## 2019-03-25 ENCOUNTER — Other Ambulatory Visit: Payer: Self-pay

## 2019-03-25 ENCOUNTER — Encounter: Payer: Self-pay | Admitting: Orthopedic Surgery

## 2019-03-25 ENCOUNTER — Ambulatory Visit (INDEPENDENT_AMBULATORY_CARE_PROVIDER_SITE_OTHER): Payer: BLUE CROSS/BLUE SHIELD | Admitting: Orthopedic Surgery

## 2019-03-25 VITALS — Ht 69.0 in | Wt 282.0 lb

## 2019-03-25 DIAGNOSIS — M75121 Complete rotator cuff tear or rupture of right shoulder, not specified as traumatic: Secondary | ICD-10-CM | POA: Diagnosis not present

## 2019-03-25 DIAGNOSIS — S46011A Strain of muscle(s) and tendon(s) of the rotator cuff of right shoulder, initial encounter: Secondary | ICD-10-CM

## 2019-03-26 ENCOUNTER — Encounter: Payer: Self-pay | Admitting: Orthopedic Surgery

## 2019-03-26 NOTE — Progress Notes (Signed)
Post-Op Visit Note   Patient: Ann Morgan           Date of Birth: 04/21/1970           MRN: [id number] Visit Date: 03/25/2019 PCP: Ann Hartshorn, FNP   Assessment & Plan:  Chief Complaint:  Chief Complaint  Patient presents with  . Right Shoulder - Follow-up    12/25/2018 Right Shoulder Arthroscopy, Biceps Tendon Release with Tenodesis, Three Tendon RCR   Visit Diagnoses:  1. Traumatic complete tear of right rotator cuff, initial encounter     Plan: Ann Morgan is now about 3 months out right shoulder arthroscopy with massive rotator cuff tear repair.  She is starting to do some band strengthening and physical therapy.  She also has a pulley at home and is working on active assisted range of motion.  She is back at work doing desk work.  She is not taking anything for pain.  On exam she has improving strength with infraspinatus and subscap testing.  Supraspinatus is still little bit weak but passive range of motion is seamless on manual examination.  Plan at this time is to continue to work on strengthening as well as passive range of motion and therapy.  I think overall though for her massive tendon tears her shoulder is progressing well.  Come back in 8 weeks for clinical recheck.  Follow-Up Instructions: Return in about 8 weeks (around 05/20/2019).   Orders:  No orders of the defined types were placed in this encounter.  No orders of the defined types were placed in this encounter.   Imaging: No results found.  PMFS History: Patient Active Problem List   Diagnosis Date Noted  . Endometrial hyperplasia without atypia, simple 05/17/2017  . Allergic rhinitis 03/07/2017  . Patellofemoral pain syndrome of both knees 02/04/2017  . Glucose intolerance 01/01/2017  . Overactive bladder 01/01/2017  . Body mass index (BMI) of 45.0-49.9 in adult ([place]) 09/19/2015  . Breast cyst 12/11/2012  . Abnormal mammogram 06/09/2012  . Polycystic ovaries 03/29/2010   Past Medical  History:  Diagnosis Date  . Abnormal Pap smear of cervix   . Asthma   . Endometrial hyperplasia   . Hypertension 2004  . Irregular menses   . Mild intermittent asthma with acute exacerbation 10/19/2013   Late 2013 with a cough that lasted approximately one month.  PFTs showed FEV1 of 83%  Last Assessment & Plan:  Reports that since she was prescribed albuterol, she has used it maybe 1x to see how she would respond to it which seemed to help. However, she still has some coughing issues but thinks it is manageable. No issues with high pollen season.   Plan: - Continue with albuterol PRN.  - She will  . Obesity     Family History  Problem Relation Age of Onset  . Hypertension Mother   . Diabetes Brother   . Hypertension Brother   . Breast cancer Maternal Grandmother 29    Past Surgical History:  Procedure Laterality Date  . BREAST CYST ASPIRATION Left 2015   benign  . KNEE SURGERY Left jan 2011   orthoscopic  . SHOULDER ARTHROSCOPY WITH SUBACROMIAL DECOMPRESSION, ROTATOR CUFF REPAIR AND BICEP TENDON REPAIR Right 12/25/2018   Procedure: right shoulder arthroscopy, biceps tendon release with tenodesis, three tendon rotator cuff repair;  Surgeon: Meredith Pel, MD;  Location: [place];  Service: Orthopedics;  Laterality: Right;   Social History   Occupational History  . Not on file  Tobacco Use  . Smoking status: Never Smoker  . Smokeless tobacco: Never Used  Substance and Sexual Activity  . Alcohol use: No  . Drug use: No  . Sexual activity: Yes    Partners: Male    Birth control/protection: None

## 2019-03-30 DIAGNOSIS — M75121 Complete rotator cuff tear or rupture of right shoulder, not specified as traumatic: Secondary | ICD-10-CM | POA: Diagnosis not present

## 2019-04-01 DIAGNOSIS — M75121 Complete rotator cuff tear or rupture of right shoulder, not specified as traumatic: Secondary | ICD-10-CM | POA: Diagnosis not present

## 2019-04-08 DIAGNOSIS — M75121 Complete rotator cuff tear or rupture of right shoulder, not specified as traumatic: Secondary | ICD-10-CM | POA: Diagnosis not present

## 2019-04-10 ENCOUNTER — Encounter: Payer: Self-pay | Admitting: Family Medicine

## 2019-04-10 ENCOUNTER — Other Ambulatory Visit: Payer: Self-pay

## 2019-04-10 ENCOUNTER — Ambulatory Visit (INDEPENDENT_AMBULATORY_CARE_PROVIDER_SITE_OTHER): Payer: BLUE CROSS/BLUE SHIELD | Admitting: Family Medicine

## 2019-04-10 VITALS — BP 132/86 | HR 85 | Temp 97.6°F | Resp 14 | Ht 69.0 in | Wt 293.2 lb

## 2019-04-10 DIAGNOSIS — Z23 Encounter for immunization: Secondary | ICD-10-CM | POA: Diagnosis not present

## 2019-04-10 DIAGNOSIS — E8881 Metabolic syndrome: Secondary | ICD-10-CM

## 2019-04-10 DIAGNOSIS — Z9889 Other specified postprocedural states: Secondary | ICD-10-CM

## 2019-04-10 DIAGNOSIS — I10 Essential (primary) hypertension: Secondary | ICD-10-CM

## 2019-04-10 DIAGNOSIS — Z6841 Body Mass Index (BMI) 40.0 and over, adult: Secondary | ICD-10-CM

## 2019-04-10 DIAGNOSIS — S46011A Strain of muscle(s) and tendon(s) of the rotator cuff of right shoulder, initial encounter: Secondary | ICD-10-CM | POA: Insufficient documentation

## 2019-04-10 DIAGNOSIS — S46011D Strain of muscle(s) and tendon(s) of the rotator cuff of right shoulder, subsequent encounter: Secondary | ICD-10-CM | POA: Diagnosis not present

## 2019-04-10 DIAGNOSIS — E282 Polycystic ovarian syndrome: Secondary | ICD-10-CM

## 2019-04-10 DIAGNOSIS — N8501 Benign endometrial hyperplasia: Secondary | ICD-10-CM

## 2019-04-10 MED ORDER — TRIAMTERENE-HCTZ 37.5-25 MG PO CAPS: 1.0000 | ORAL_CAPSULE | ORAL | 1 refills | Status: DC
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
## 2019-04-11 LAB — HEMOGLOBIN A1C
Hgb A1c MFr Bld: 5.6 % of total Hgb (ref <5.7)
Mean Plasma Glucose: 114 (calc)
eAG (mmol/L): 6.3 (calc)

## 2019-04-11 LAB — COMPLETE METABOLIC PANEL WITH GFR
AG Ratio: 1.3 (calc) (ref 1.0–2.5)
ALT: 16 U/L (ref 6–29)
AST: 21 U/L (ref 10–35)
Albumin: 4.1 g/dL (ref 3.6–5.1)
Alkaline phosphatase (APISO): 78 U/L (ref 31–125)
BUN: 12 mg/dL (ref 7–25)
CO2: 30 mmol/L (ref 20–32)
Calcium: 9.9 mg/dL (ref 8.6–10.2)
Chloride: 99 mmol/L (ref 98–110)
Creat: 0.88 mg/dL (ref 0.50–1.10)
GFR, Est African American: 91 mL/min/{1.73_m2} (ref >=60)
GFR, Est Non African American: 79 mL/min/{1.73_m2} (ref >=60)
Globulin: 3.2 g/dL (calc) (ref 1.9–3.7)
Glucose, Bld: 88 mg/dL (ref 65–99)
Potassium: 3.7 mmol/L (ref 3.5–5.3)
Sodium: 137 mmol/L (ref 135–146)
Total Bilirubin: 0.4 mg/dL (ref 0.2–1.2)
Total Protein: 7.3 g/dL (ref 6.1–8.1)

## 2019-04-15 DIAGNOSIS — M75121 Complete rotator cuff tear or rupture of right shoulder, not specified as traumatic: Secondary | ICD-10-CM | POA: Diagnosis not present

## 2019-04-20 ENCOUNTER — Ambulatory Visit: Payer: BLUE CROSS/BLUE SHIELD | Admitting: Obstetrics and Gynecology

## 2019-04-22 DIAGNOSIS — M75121 Complete rotator cuff tear or rupture of right shoulder, not specified as traumatic: Secondary | ICD-10-CM | POA: Diagnosis not present

## 2019-04-23 ENCOUNTER — Ambulatory Visit (INDEPENDENT_AMBULATORY_CARE_PROVIDER_SITE_OTHER): Payer: BLUE CROSS/BLUE SHIELD | Admitting: Obstetrics and Gynecology

## 2019-04-23 ENCOUNTER — Other Ambulatory Visit: Payer: Self-pay

## 2019-04-23 ENCOUNTER — Encounter: Payer: Self-pay | Admitting: Obstetrics and Gynecology

## 2019-04-23 ENCOUNTER — Ambulatory Visit
Admission: RE | Admit: 2019-04-23 | Discharge: 2019-04-23 | Disposition: A | Discharge status: Home / Self Care | Payer: BLUE CROSS/BLUE SHIELD | Source: Ambulatory Visit | Attending: Obstetrics and Gynecology | Admitting: Obstetrics and Gynecology

## 2019-04-23 VITALS — BP 126/84 | Ht 69.0 in | Wt 301.0 lb

## 2019-04-23 DIAGNOSIS — Z01419 Encounter for gynecological examination (general) (routine) without abnormal findings: Secondary | ICD-10-CM | POA: Diagnosis not present

## 2019-04-23 DIAGNOSIS — N8501 Benign endometrial hyperplasia: Secondary | ICD-10-CM | POA: Diagnosis not present

## 2019-04-23 DIAGNOSIS — Z1231 Encounter for screening mammogram for malignant neoplasm of breast: Secondary | ICD-10-CM | POA: Diagnosis not present

## 2019-04-23 DIAGNOSIS — Z1339 Encounter for screening examination for other mental health and behavioral disorders: Secondary | ICD-10-CM

## 2019-04-23 DIAGNOSIS — Z1331 Encounter for screening for depression: Secondary | ICD-10-CM

## 2019-04-23 MED ORDER — MEDROXYPROGESTERONE ACETATE 10 MG PO TABS: 10.0000 mg | ORAL_TABLET | Freq: Every day | ORAL | 2 refills | Status: DC
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
## 2019-04-29 DIAGNOSIS — M75121 Complete rotator cuff tear or rupture of right shoulder, not specified as traumatic: Secondary | ICD-10-CM | POA: Diagnosis not present

## 2019-05-11 DIAGNOSIS — M75121 Complete rotator cuff tear or rupture of right shoulder, not specified as traumatic: Secondary | ICD-10-CM | POA: Diagnosis not present

## 2019-05-18 DIAGNOSIS — M75121 Complete rotator cuff tear or rupture of right shoulder, not specified as traumatic: Secondary | ICD-10-CM | POA: Diagnosis not present

## 2019-05-25 ENCOUNTER — Other Ambulatory Visit: Payer: Self-pay

## 2019-05-25 ENCOUNTER — Ambulatory Visit (INDEPENDENT_AMBULATORY_CARE_PROVIDER_SITE_OTHER): Payer: BLUE CROSS/BLUE SHIELD | Admitting: Orthopedic Surgery

## 2019-05-25 DIAGNOSIS — S46011A Strain of muscle(s) and tendon(s) of the rotator cuff of right shoulder, initial encounter: Secondary | ICD-10-CM

## 2019-05-25 DIAGNOSIS — M75121 Complete rotator cuff tear or rupture of right shoulder, not specified as traumatic: Secondary | ICD-10-CM | POA: Diagnosis not present

## 2019-05-28 ENCOUNTER — Encounter: Payer: Self-pay | Admitting: Orthopedic Surgery

## 2019-05-28 NOTE — Progress Notes (Signed)
Office Visit Note   Patient: Ann Morgan           Date of Birth: 1970-06-01           MRN: [id number] Visit Date: 05/25/2019 Requested by: Hubbard Hartshorn, [address] [place],  [place] [address] PCP: Hubbard Hartshorn, FNP  Subjective: No chief complaint on file.   HPI: Ann Morgan is a patient is now about 5 months out right shoulder arthroscopy with biceps tenodesis and 3 tendon rotator cuff repair.  She is going to therapy.  She is improving.  Range of motion is better.  She is continuing with strength training and therapy.  She is becoming more functional overhead.              ROS: All systems reviewed are negative as they relate to the chief complaint within the history of present illness.  Patient denies  fevers or chills.   Assessment & Plan: Visit Diagnoses:  1. Traumatic complete tear of right rotator cuff, initial encounter     Plan: Impression is right shoulder rotator cuff tear which was a massive 3 tendon tear.  Overall she is doing quite well.  She does have forward flexion and abduction both above 90 degrees.  Strength is improving in this range of motion.  Subscap strength is good.  Continue therapy through April then proceed to home exercise program.  66-month return for final clinical recheck.  Follow-Up Instructions: Return in about 6 months (around 11/25/2019).   Orders:  No orders of the defined types were placed in this encounter.  No orders of the defined types were placed in this encounter.     Procedures: No procedures performed   Clinical Data: No additional findings.  Objective: Vital Signs: There were no vitals taken for this visit.  Physical Exam:   Constitutional: Patient appears well-developed HEENT:  Head: Normocephalic Eyes:EOM are normal Neck: Normal range of motion Cardiovascular: Normal rate Pulmonary/chest: Effort normal Neurologic: Patient is alert Skin: Skin is warm Psychiatric: Patient has normal mood and  affect    Ortho Exam: Ortho exam demonstrates full active and passive range of motion of the cervical spine.  On the right shoulder she has forward flexion abduction above 90.  Cuff strength is improving.  Subscap strength is good at 5 out of 5.  Infraspinatus supraspinatus strength is about 5- out of 5.  No coarse grinding or crepitus with internal extra rotation at 90 degrees of abduction.  Specialty Comments:  No specialty comments available.  Imaging: No results found.   PMFS History: Patient Active Problem List   Diagnosis Date Noted  . Traumatic complete tear of right rotator cuff 04/10/2019  . Endometrial hyperplasia without atypia, simple 05/17/2017  . Allergic rhinitis 03/07/2017  . Patellofemoral pain syndrome of both knees 02/04/2017  . Glucose intolerance 01/01/2017  . Overactive bladder 01/01/2017  . Body mass index (BMI) of 45.0-49.9 in adult ([place]) 09/19/2015  . Breast cyst 12/11/2012  . Abnormal mammogram 06/09/2012  . Polycystic ovaries 03/29/2010   Past Medical History:  Diagnosis Date  . Abnormal Pap smear of cervix   . Asthma   . Endometrial hyperplasia   . Hypertension 2004  . Irregular menses   . Mild intermittent asthma with acute exacerbation 10/19/2013   Late 2013 with a cough that lasted approximately one month.  PFTs showed FEV1 of 83%  Last Assessment & Plan:  Reports that since she was prescribed albuterol, she has used it  maybe 1x to see how she would respond to it which seemed to help. However, she still has some coughing issues but thinks it is manageable. No issues with high pollen season.   Plan: - Continue with albuterol PRN.  - She will  . Obesity     Family History  Problem Relation Age of Onset  . Hypertension Mother   . Diabetes Brother   . Hypertension Brother   . Breast cancer Maternal Grandmother 71    Past Surgical History:  Procedure Laterality Date  . BREAST CYST ASPIRATION Left 2015   benign  . KNEE SURGERY Left jan 2011    orthoscopic  . SHOULDER ARTHROSCOPY WITH SUBACROMIAL DECOMPRESSION, ROTATOR CUFF REPAIR AND BICEP TENDON REPAIR Right 12/25/2018   Procedure: right shoulder arthroscopy, biceps tendon release with tenodesis, three tendon rotator cuff repair;  Surgeon: Meredith Pel, MD;  Location: [place];  Service: Orthopedics;  Laterality: Right;   Social History   Occupational History  . Not on file  Tobacco Use  . Smoking status: Never Smoker  . Smokeless tobacco: Never Used  Substance and Sexual Activity  . Alcohol use: No  . Drug use: No  . Sexual activity: Yes    Partners: Male    Birth control/protection: None

## 2019-06-01 DIAGNOSIS — M75121 Complete rotator cuff tear or rupture of right shoulder, not specified as traumatic: Secondary | ICD-10-CM | POA: Diagnosis not present

## 2019-06-06 ENCOUNTER — Ambulatory Visit: Payer: BLUE CROSS/BLUE SHIELD | Attending: Internal Medicine

## 2019-06-06 DIAGNOSIS — Z23 Encounter for immunization: Secondary | ICD-10-CM

## 2019-06-06 NOTE — Progress Notes (Signed)
   Covid-19 Vaccination Clinic  Name:  Ann Morgan    MRN: [id number] DOB: 12-20-1970  06/06/2019  [name] was observed post Covid-19 immunization for 15 minutes without incident. She was provided with Vaccine Information Sheet and instruction to access the V-Safe system.   [name] was instructed to call 911 with any severe reactions post vaccine: Marland Kitchen Difficulty breathing  . Swelling of face and throat  . A fast heartbeat  . A bad rash all over body  . Dizziness and weakness   Immunizations Administered    Name Date Dose VIS Date Route   Pfizer COVID-19 Vaccine 06/06/2019  4:09 PM 0.3 mL 02/27/2019 Intramuscular   Manufacturer: [place]   Lot: [id number]   [place]: [id number]

## 2019-06-08 DIAGNOSIS — M75121 Complete rotator cuff tear or rupture of right shoulder, not specified as traumatic: Secondary | ICD-10-CM | POA: Diagnosis not present

## 2019-06-15 DIAGNOSIS — M75121 Complete rotator cuff tear or rupture of right shoulder, not specified as traumatic: Secondary | ICD-10-CM | POA: Diagnosis not present

## 2019-06-30 ENCOUNTER — Ambulatory Visit: Payer: BLUE CROSS/BLUE SHIELD | Attending: Internal Medicine

## 2019-06-30 DIAGNOSIS — Z23 Encounter for immunization: Secondary | ICD-10-CM

## 2019-06-30 NOTE — Progress Notes (Signed)
   Covid-19 Vaccination Clinic  Name:  Lajuanda Yaffe    MRN: [id number] DOB: 1970-10-06  06/30/2019  Ms. Robinson-Strausbaugh was observed post Covid-19 immunization for 15 minutes without incident. She was provided with Vaccine Information Sheet and instruction to access the V-Safe system.   Ms. Cazenave was instructed to call 911 with any severe reactions post vaccine: Marland Kitchen Difficulty breathing  . Swelling of face and throat  . A fast heartbeat  . A bad rash all over body  . Dizziness and weakness   Immunizations Administered    Name Date Dose VIS Date Route   Pfizer COVID-19 Vaccine 06/30/2019  2:01 PM 0.3 mL 02/27/2019 Intramuscular   Manufacturer: [place]   Lot: [id number]   [place]: [id number]

## 2019-10-08 ENCOUNTER — Encounter: Payer: Self-pay | Admitting: Family Medicine

## 2019-10-08 ENCOUNTER — Ambulatory Visit (INDEPENDENT_AMBULATORY_CARE_PROVIDER_SITE_OTHER): Payer: BLUE CROSS/BLUE SHIELD | Admitting: Family Medicine

## 2019-10-08 ENCOUNTER — Other Ambulatory Visit: Payer: Self-pay

## 2019-10-08 ENCOUNTER — Ambulatory Visit: Payer: BLUE CROSS/BLUE SHIELD | Admitting: Family Medicine

## 2019-10-08 VITALS — BP 122/84 | HR 83 | Temp 97.8°F | Resp 16 | Ht 69.0 in | Wt 293.9 lb

## 2019-10-08 DIAGNOSIS — I10 Essential (primary) hypertension: Secondary | ICD-10-CM | POA: Diagnosis not present

## 2019-10-08 DIAGNOSIS — Z5181 Encounter for therapeutic drug level monitoring: Secondary | ICD-10-CM

## 2019-10-08 LAB — COMPLETE METABOLIC PANEL WITH GFR
AG Ratio: 1.3 (calc) (ref 1.0–2.5)
ALT: 18 U/L (ref 6–29)
AST: 23 U/L (ref 10–35)
Albumin: 4 g/dL (ref 3.6–5.1)
Alkaline phosphatase (APISO): 74 U/L (ref 31–125)
BUN: 14 mg/dL (ref 7–25)
CO2: 30 mmol/L (ref 20–32)
Calcium: 9.7 mg/dL (ref 8.6–10.2)
Chloride: 101 mmol/L (ref 98–110)
Creat: 0.88 mg/dL (ref 0.50–1.10)
GFR, Est African American: 91 mL/min/{1.73_m2} (ref >=60)
GFR, Est Non African American: 78 mL/min/{1.73_m2} (ref >=60)
Globulin: 3 g/dL (calc) (ref 1.9–3.7)
Glucose, Bld: 86 mg/dL (ref 65–99)
Potassium: 3.8 mmol/L (ref 3.5–5.3)
Sodium: 139 mmol/L (ref 135–146)
Total Bilirubin: 0.4 mg/dL (ref 0.2–1.2)
Total Protein: 7 g/dL (ref 6.1–8.1)

## 2019-10-08 MED ORDER — TRIAMTERENE-HCTZ 37.5-25 MG PO CAPS: 1.0000 | ORAL_CAPSULE | ORAL | 3 refills | Status: DC

## 2019-10-08 NOTE — Progress Notes (Signed)
Name: Ann Morgan   MRN: [id number]    DOB: 05-08-1970   Date:10/08/2019       Progress Note  Chief Complaint  Patient presents with  . Follow-up    6 months  . Hypertension     Subjective:   Ann Morgan is a 49 y.o. female, presents to clinic for routine f/up  Hypertension:  Currently managed on triamterene-HCTZ Pt reports good med compliance and denies any SE.  No lightheadedness, hypotension, syncope. Blood pressure today is well controlled. BP Readings from Last 3 Encounters:  10/08/19 122/84  04/23/19 126/84  04/10/19 132/86   Pt denies CP, SOB, exertional sx, LE edema, palpitation, Ha's, visual disturbances   Obesity:she has lost some weight since last OV 6 months ago Wt Readings from Last 5 Encounters:  10/08/19 293 lb 14.4 oz (133.3 kg)  04/23/19 (!) 301 lb (136.5 kg)  04/10/19 293 lb 3.2 oz (133 kg)  03/25/19 282 lb (127.9 kg)  01/19/19 287 lb (130.2 kg)   BMI Readings from Last 5 Encounters:  10/08/19 43.40 kg/m  04/23/19 44.45 kg/m  04/10/19 43.30 kg/m  03/25/19 41.64 kg/m  01/19/19 42.38 kg/m   Prior HPI Up several pounds since last visit.She is walking 4 times a week for about 2 miles and doing some strength training - she is not as active in the winter. Diet has beenvery good lately. 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 Declines nutritionist.   Endometrial Hyperplasia:Seeing Dr. Glennon Mac. Taking Provera PRN for lack of menses once a quarter or so - LMP was approx 03/13/19 lasted about 2-3 days. No abdominal pain; bleeding has been lighter but does have clots, some cramping with menses but has been better with lighter cycles.She has routine follow up with Dr. Glennon Mac in February.  PCOS/Insulin Resistance: Was diagnosed a few years ago. She is not taking any medication for this at this time - was on metformin initially to try to regulate cycles but has not been on anything since then - does not  want medication at this time. She does have acanthosis nigricans to the neck, breasts, thighs, and axilla.Last A1C was 5.6%. She denies polydipsia, or polyuria. Endorses some mild to moderate polyphagia    Current Outpatient Medications:  .  medroxyPROGESTERone (PROVERA) 10 MG tablet, Take 1 tablet (10 mg total) by mouth daily. Take days 1-10 of each month, Disp: 30 tablet, Rfl: 2 .  naproxen sodium (ALEVE) 220 MG tablet, Take 440 mg by mouth daily as needed (pain)., Disp: , Rfl:  .  Prenatal Vit-Fe Fumarate-FA (PRENATAL MULTIVITAMIN) TABS, Take 1 tablet by mouth daily. , Disp: , Rfl:  .  triamterene-hydrochlorothiazide (DYAZIDE) 37.5-25 MG capsule, Take 1 each (1 capsule total) by mouth every morning., Disp: 90 capsule, Rfl: 1  Patient Active Problem List   Diagnosis Date Noted  . Traumatic complete tear of right rotator cuff 04/10/2019  . Endometrial hyperplasia without atypia, simple 05/17/2017  . Allergic rhinitis 03/07/2017  . Patellofemoral pain syndrome of both knees 02/04/2017  . Glucose intolerance 01/01/2017  . Overactive bladder 01/01/2017  . Body mass index (BMI) of 45.0-49.9 in adult ([place]) 09/19/2015  . Breast cyst 12/11/2012  . Abnormal mammogram 06/09/2012  . Polycystic ovaries 03/29/2010    Past Surgical History:  Procedure Laterality Date  . BREAST CYST ASPIRATION Left 2015   benign  . KNEE SURGERY Left jan 2011   orthoscopic  . SHOULDER ARTHROSCOPY WITH SUBACROMIAL DECOMPRESSION, ROTATOR CUFF  REPAIR AND BICEP TENDON REPAIR Right 12/25/2018   Procedure: right shoulder arthroscopy, biceps tendon release with tenodesis, three tendon rotator cuff repair;  Surgeon: Meredith Pel, MD;  Location: [facility];  Service: Orthopedics;  Laterality: Right;    Family History  Problem Relation Age of Onset  . Hypertension Mother   . Diabetes Brother   . Hypertension Brother   . Breast cancer Maternal Grandmother 50    Social History   Tobacco Use  . Smoking status:  Never Smoker  . Smokeless tobacco: Never Used  Vaping Use  . Vaping Use: Never used  Substance Use Topics  . Alcohol use: No  . Drug use: No     No Known Allergies  Health Maintenance  Topic Date Due  . INFLUENZA VACCINE  10/18/2019  . PAP SMEAR-Modifier  03/28/2021  . TETANUS/TDAP  05/16/2025  . COVID-19 Vaccine  Completed  . Hepatitis C Screening  Completed  . HIV Screening  Completed    Chart Review Today: I personally reviewed active problem list, medication list, allergies, family history, social history, health maintenance, notes from last encounter, lab results, imaging with the patient/caregiver today.   Review of Systems  10 Systems reviewed and are negative for acute change except as noted in the HPI.  Objective:   Vitals:   10/08/19 0824  BP: 122/84  Pulse: 83  Resp: 16  Temp: 97.8 F (36.6 C)  TempSrc: Temporal  SpO2: 99%  Weight: 293 lb 14.4 oz (133.3 kg)  Height: 5\' 9"  (1.753 m)    Body mass index is 43.4 kg/m.  Physical Exam Vitals and nursing note reviewed.  Constitutional:      General: She is not in acute distress.    Appearance: Normal appearance. She is well-developed. She is not ill-appearing, toxic-appearing or diaphoretic.     Interventions: Face mask in place.  HENT:     Head: Normocephalic and atraumatic.     Right Ear: External ear normal.     Left Ear: External ear normal.  Eyes:     General: Lids are normal. No scleral icterus.       Right eye: No discharge.        Left eye: No discharge.     Conjunctiva/sclera: Conjunctivae normal.  Neck:     Trachea: Phonation normal. No tracheal deviation.  Cardiovascular:     Rate and Rhythm: Normal rate and regular rhythm.     Pulses: Normal pulses.          Radial pulses are 2+ on the right side and 2+ on the left side.       Posterior tibial pulses are 2+ on the right side and 2+ on the left side.     Heart sounds: Normal heart sounds. No murmur heard.  No friction rub. No gallop.    Pulmonary:     Effort: Pulmonary effort is normal. No respiratory distress.     Breath sounds: Normal breath sounds. No stridor. No wheezing, rhonchi or rales.  Chest:     Chest wall: No tenderness.  Abdominal:     General: Bowel sounds are normal. There is no distension.     Palpations: Abdomen is soft.     Tenderness: There is no abdominal tenderness.  Musculoskeletal:     Cervical back: Normal range of motion and neck supple.     Right lower leg: No edema.     Left lower leg: No edema.  Lymphadenopathy:     Cervical: No  cervical adenopathy.  Skin:    General: Skin is warm and dry.     Capillary Refill: Capillary refill takes less than 2 seconds.     Coloration: Skin is not jaundiced or pale.     Findings: No rash.  Neurological:     Mental Status: She is alert and oriented to person, place, and time.     Motor: No abnormal muscle tone.     Gait: Gait normal.  Psychiatric:        Mood and Affect: Mood normal.        Speech: Speech normal.        Behavior: Behavior normal.         Assessment & Plan:   1. Essential hypertension Stable, well controlled - COMPLETE METABOLIC PANEL WITH GFR - triamterene-hydrochlorothiazide (DYAZIDE) 37.5-25 MG capsule; Take 1 each (1 capsule total) by mouth every morning.  Dispense: 90 capsule; Refill: 3  2. Medication monitoring encounter - COMPLETE METABOLIC PANEL WITH GFR   Return in about 6 months (around 04/09/2020) for Routine follow-up - come fasting for labs.   Delsa Grana, PA-C 10/08/19 8:40 AM

## 2019-12-18 ENCOUNTER — Ambulatory Visit (INDEPENDENT_AMBULATORY_CARE_PROVIDER_SITE_OTHER): Payer: BLUE CROSS/BLUE SHIELD | Admitting: Orthopedic Surgery

## 2019-12-18 DIAGNOSIS — S46011D Strain of muscle(s) and tendon(s) of the rotator cuff of right shoulder, subsequent encounter: Secondary | ICD-10-CM

## 2019-12-19 ENCOUNTER — Encounter: Payer: Self-pay | Admitting: Orthopedic Surgery

## 2019-12-19 NOTE — Progress Notes (Signed)
Office Visit Note   Patient: Ann Morgan           Date of Birth: 01-Dec-1970           MRN: [id number] Visit Date: 12/18/2019 Requested by: Hubbard Hartshorn, [address] [place],  [place] [address] PCP: Delsa Grana, PA-C  Subjective: Chief Complaint  Patient presents with  . Right Shoulder - Routine Post Op    HPI: Ann Morgan is a 49 y.o. female who presents to the office s/p right shoulder arthroscopy with biceps tendon release and tenodesis and 3 tendon rotator cuff repair on 12/25/2018. Patient notes that she feels she has some decreased range of motion of the right side compared with the contralateral side. She overall feels like she is doing well with the right arm over the last year. She is not in physical therapy any longer but does continue to do exercises on her own. She is able to raise her arm above her head under its own power though she does have to occasionally help support the arm on his way down with her left arm. She does not have to take any medications for pain. 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 Her husband often is able to assist her..                ROS: All systems reviewed are negative as they relate to the chief complaint within the history of present illness.  Patient denies fevers or chills.  Assessment & Plan: Visit Diagnoses:  1. Traumatic complete tear of right rotator cuff, subsequent encounter     Plan: Patient is a 49 year old female presents s/p massive 3 tendon rotator cuff tear repair on 12/25/2018. Overall she seems like she has progressed very well. 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 She has no difficulties with her activities of daily living. She is able to easily raise her right arm above her head though occasionally she does have to help lower it down with the other arm.  Considering the extent of her rotator cuff tear, the strength that she has recovered is excellent on exam today. Range of motion of the right shoulder is good as well, only slightly decreased compared with contralateral side. Plan for patient to follow-up with the office as needed. Patient agreed with this plan.  Follow-Up Instructions: No follow-ups on file.   Orders:  No orders of the defined types were placed in this encounter.  No orders of the defined types were placed in this encounter.     Procedures: No procedures performed   Clinical Data: No additional findings.  Objective: Vital Signs: There were no vitals taken for this visit.  Physical Exam:  Constitutional: Patient appears well-developed HEENT:  Head: Normocephalic Eyes:EOM are normal Neck: Normal range of motion Cardiovascular: Normal rate Pulmonary/chest: Effort normal Neurologic: Patient is alert Skin: Skin is warm Psychiatric: Patient has normal mood and affect  Ortho Exam: Ortho exam demonstrates right shoulder with 70 degrees external rotation, 90 degrees abduction, 130 to 140 degrees forward flexion. She has good strength of the supraspinatus, infraspinatus, subscapularis that is maybe 15% less than the nonsurgical arm. 5/5 motor strength with bilateral grip strength, finger abduction, pronation/supination, bicep, tricep. Incisions are well-healed with no evidence of infection. No crepitus felt with passive range of motion of the shoulder. Negative impingement signs.  Specialty Comments:  No specialty comments available.  Imaging: No results found.   PMFS History: Patient Active Problem List   Diagnosis Date Noted  . Traumatic complete tear of right rotator cuff 04/10/2019  . Endometrial hyperplasia without atypia, simple 05/17/2017  . Allergic rhinitis 03/07/2017  . Patellofemoral pain syndrome of both knees 02/04/2017  . Glucose intolerance 01/01/2017  . Overactive bladder 01/01/2017  . Body  mass index (BMI) of 45.0-49.9 in adult ([place]) 09/19/2015  . Breast cyst 12/11/2012  . Abnormal mammogram 06/09/2012  . Polycystic ovaries 03/29/2010   Past Medical History:  Diagnosis Date  . Abnormal Pap smear of cervix   . Asthma   . Endometrial hyperplasia   . Hypertension 2004  . Irregular menses   . Mild intermittent asthma with acute exacerbation 10/19/2013   Late 2013 with a cough that lasted approximately one month.  PFTs showed FEV1 of 83%  Last Assessment & Plan:  Reports that since she was prescribed albuterol, she has used it maybe 1x to see how she would respond to it which seemed to help. However, she still has some coughing issues but thinks it is manageable. No issues with high pollen season.   Plan: - Continue with albuterol PRN.  - She will  . Obesity     Family History  Problem Relation Age of Onset  . Hypertension Mother   . Diabetes Brother   . Hypertension Brother   . Breast cancer Maternal Grandmother 67    Past Surgical History:  Procedure Laterality Date  . BREAST CYST ASPIRATION Left 2015   benign  . KNEE SURGERY Left jan 2011   orthoscopic  . SHOULDER ARTHROSCOPY WITH SUBACROMIAL DECOMPRESSION, ROTATOR CUFF REPAIR AND BICEP TENDON REPAIR Right 12/25/2018   Procedure: right shoulder arthroscopy, biceps tendon release with tenodesis, three tendon rotator cuff repair;  Surgeon: Meredith Pel, MD;  Location: [facility];  Service: Orthopedics;  Laterality: Right;   Social History   Occupational History  . Not on file  Tobacco Use  . Smoking status: Never Smoker  . Smokeless tobacco: Never Used  Vaping Use  . Vaping Use: Never used  Substance and Sexual Activity  . Alcohol use: No  . Drug use: No  . Sexual activity: Yes    Partners: Male    Birth control/protection: None

## 2020-01-09 ENCOUNTER — Encounter: Payer: Self-pay | Admitting: Orthopedic Surgery

## 2020-02-23 ENCOUNTER — Ambulatory Visit: Payer: BLUE CROSS/BLUE SHIELD | Attending: Internal Medicine

## 2020-02-23 DIAGNOSIS — Z23 Encounter for immunization: Secondary | ICD-10-CM

## 2020-02-23 NOTE — Progress Notes (Signed)
   Covid-19 Vaccination Clinic  Name:  Ann Morgan    MRN: [id number] DOB: June 03, 1970  02/23/2020  Ann Morgan was observed post Covid-19 immunization for 15 minutes without incident. She was provided with Vaccine Information Sheet and instruction to access the V-Safe system.   Ann Morgan was instructed to call 911 with any severe reactions post vaccine: Marland Kitchen Difficulty breathing  . Swelling of face and throat  . A fast heartbeat  . A bad rash all over body  . Dizziness and weakness   Immunizations Administered    Name Date Dose VIS Date Route   Pfizer COVID-19 Vaccine 02/23/2020  5:02 PM 0.3 mL 01/06/2020 Intramuscular   Manufacturer: [place]   Lot: [id number]   NDC: [address]-6

## 2020-03-04 ENCOUNTER — Other Ambulatory Visit: Payer: Self-pay | Admitting: Obstetrics and Gynecology

## 2020-03-04 DIAGNOSIS — Z1231 Encounter for screening mammogram for malignant neoplasm of breast: Secondary | ICD-10-CM

## 2020-04-11 ENCOUNTER — Ambulatory Visit: Payer: BLUE CROSS/BLUE SHIELD | Admitting: Family Medicine

## 2020-05-06 ENCOUNTER — Other Ambulatory Visit: Payer: Self-pay

## 2020-05-06 ENCOUNTER — Encounter: Payer: Self-pay | Admitting: Obstetrics and Gynecology

## 2020-05-06 ENCOUNTER — Ambulatory Visit: Payer: BLUE CROSS/BLUE SHIELD | Admitting: Family Medicine

## 2020-05-06 ENCOUNTER — Ambulatory Visit (INDEPENDENT_AMBULATORY_CARE_PROVIDER_SITE_OTHER): Payer: BLUE CROSS/BLUE SHIELD | Admitting: Obstetrics and Gynecology

## 2020-05-06 ENCOUNTER — Ambulatory Visit
Admission: RE | Admit: 2020-05-06 | Discharge: 2020-05-06 | Disposition: A | Discharge status: Home / Self Care | Payer: BLUE CROSS/BLUE SHIELD | Source: Ambulatory Visit | Attending: Obstetrics and Gynecology | Admitting: Obstetrics and Gynecology

## 2020-05-06 VITALS — BP 126/84 | Ht 69.0 in | Wt 299.0 lb

## 2020-05-06 DIAGNOSIS — Z01419 Encounter for gynecological examination (general) (routine) without abnormal findings: Secondary | ICD-10-CM

## 2020-05-06 DIAGNOSIS — Z1211 Encounter for screening for malignant neoplasm of colon: Secondary | ICD-10-CM | POA: Diagnosis not present

## 2020-05-06 DIAGNOSIS — Z1339 Encounter for screening examination for other mental health and behavioral disorders: Secondary | ICD-10-CM | POA: Diagnosis not present

## 2020-05-06 DIAGNOSIS — Z1331 Encounter for screening for depression: Secondary | ICD-10-CM | POA: Diagnosis not present

## 2020-05-06 DIAGNOSIS — Z1231 Encounter for screening mammogram for malignant neoplasm of breast: Secondary | ICD-10-CM | POA: Diagnosis not present
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
## 2020-05-26 DIAGNOSIS — Z1211 Encounter for screening for malignant neoplasm of colon: Secondary | ICD-10-CM | POA: Diagnosis not present

## 2020-05-26 DIAGNOSIS — D649 Anemia, unspecified: Secondary | ICD-10-CM | POA: Diagnosis not present

## 2020-06-03 ENCOUNTER — Telehealth: Payer: Self-pay

## 2020-06-03 DIAGNOSIS — N920 Excessive and frequent menstruation with regular cycle: Secondary | ICD-10-CM | POA: Diagnosis not present

## 2020-06-03 DIAGNOSIS — D649 Anemia, unspecified: Secondary | ICD-10-CM | POA: Diagnosis not present

## 2020-06-03 NOTE — Telephone Encounter (Signed)
FMLA/DISABILITY form for [employer] filled out, signature obtained and given to [facility] for processing.

## 2020-07-21 DIAGNOSIS — Z01818 Encounter for other preprocedural examination: Secondary | ICD-10-CM | POA: Diagnosis not present

## 2020-07-25 DIAGNOSIS — K621 Rectal polyp: Secondary | ICD-10-CM | POA: Diagnosis not present

## 2020-07-25 DIAGNOSIS — K635 Polyp of colon: Secondary | ICD-10-CM | POA: Diagnosis not present

## 2020-07-25 DIAGNOSIS — Z1211 Encounter for screening for malignant neoplasm of colon: Secondary | ICD-10-CM | POA: Diagnosis not present

## 2020-07-25 DIAGNOSIS — Z6841 Body Mass Index (BMI) 40.0 and over, adult: Secondary | ICD-10-CM | POA: Diagnosis not present

## 2020-07-26 LAB — HM COLONOSCOPY

## 2020-08-11 DIAGNOSIS — M25562 Pain in left knee: Secondary | ICD-10-CM | POA: Diagnosis not present

## 2020-08-24 DIAGNOSIS — M25562 Pain in left knee: Secondary | ICD-10-CM | POA: Diagnosis not present

## 2020-09-02 ENCOUNTER — Ambulatory Visit (INDEPENDENT_AMBULATORY_CARE_PROVIDER_SITE_OTHER): Payer: BLUE CROSS/BLUE SHIELD | Admitting: Podiatry

## 2020-09-02 ENCOUNTER — Other Ambulatory Visit: Payer: Self-pay | Admitting: Podiatry

## 2020-09-02 ENCOUNTER — Other Ambulatory Visit: Payer: Self-pay

## 2020-09-02 DIAGNOSIS — L853 Xerosis cutis: Secondary | ICD-10-CM | POA: Diagnosis not present

## 2020-09-02 DIAGNOSIS — B351 Tinea unguium: Secondary | ICD-10-CM

## 2020-09-02 DIAGNOSIS — Z79899 Other long term (current) drug therapy: Secondary | ICD-10-CM

## 2020-09-02 DIAGNOSIS — IMO0002 Reserved for concepts with insufficient information to code with codable children: Secondary | ICD-10-CM | POA: Insufficient documentation

## 2020-09-02 DIAGNOSIS — M6281 Muscle weakness (generalized): Secondary | ICD-10-CM | POA: Insufficient documentation

## 2020-09-02 DIAGNOSIS — J45909 Unspecified asthma, uncomplicated: Secondary | ICD-10-CM | POA: Insufficient documentation

## 2020-09-02 MED ORDER — AMMONIUM LACTATE 12 % EX LOTN: 1.0000 "application " | TOPICAL_LOTION | CUTANEOUS | 0 refills | Status: DC | PRN

## 2020-09-03 LAB — HEPATIC FUNCTION PANEL
ALT: 15 IU/L (ref 0–32)
AST: 20 IU/L (ref 0–40)
Albumin: 4.2 g/dL (ref 3.8–4.8)
Alkaline Phosphatase: 89 IU/L (ref 44–121)
Bilirubin Total: 0.2 mg/dL (ref 0.0–1.2)
Bilirubin, Direct: 0.1 mg/dL (ref 0.00–0.40)
Total Protein: 7 g/dL (ref 6.0–8.5)

## 2020-09-05 MED ORDER — TERBINAFINE HCL 250 MG PO TABS: 250.0000 mg | ORAL_TABLET | Freq: Every day | ORAL | 0 refills | Status: DC

## 2020-09-05 NOTE — Addendum Note (Signed)
Addended by: Boneta Lucks on: 09/05/2020 10:07 AM   Modules accepted: Orders

## 2020-09-06 ENCOUNTER — Encounter: Payer: Self-pay | Admitting: Podiatry

## 2020-09-06 NOTE — Progress Notes (Signed)
Subjective:  Patient ID: Ann Morgan, female    DOB: Feb 20, 1971,  MRN: [id number]  Chief Complaint  Patient presents with   Nail Problem    Right 1st toenail injured 25 years ago, grew back thick/discolored.   Foot Problem    Right plantar foot dry peeling skin    50 y.o. female presents with the above complaint.  Patient presents with complaint of right thickened elongated dystrophic toenails x1.  Patient states that that there might be fungus involvement.  Is been going for about 25 years is progressive gotten worse as grows back thickened discolored and fallen out before.  She would like to discuss treatment options for this.  She has secondary complaint of right plantar dry skin that does not for 5 years.  She is tried over-the-counter lotions none of which has helped.  She would like to discuss prescription lotion.  She does not have a history of diabetes.   Review of Systems: Negative except as noted in the HPI. Denies N/V/F/Ch.  Past Medical History:  Diagnosis Date   Abnormal Pap smear of cervix    Asthma    Endometrial hyperplasia    Hypertension 2004   Irregular menses    Mild intermittent asthma with acute exacerbation 10/19/2013   Late 2013 with a cough that lasted approximately one month.  PFTs showed FEV1 of 83%  Last Assessment & Plan:  Reports that since she was prescribed albuterol, she has used it maybe 1x to see how she would respond to it which seemed to help. However, she still has some coughing issues but thinks it is manageable. No issues with high pollen season.   Plan: - Continue with albuterol PRN.  - She will   Obesity     Current Outpatient Medications:    ammonium lactate (AMLACTIN) 12 % lotion, Apply 1 application topically as needed for dry skin., Disp: 400 g, Rfl: 0   medroxyPROGESTERone (PROVERA) 10 MG tablet, Take 1 tablet (10 mg total) by mouth daily. Take days 1-10 of each month, Disp: 30 tablet, Rfl: 2   Prenatal Vit-Fe Fumarate-FA (PRENATAL  MULTIVITAMIN) TABS, Take 1 tablet by mouth daily. , Disp: , Rfl:    triamterene-hydrochlorothiazide (DYAZIDE) 37.5-25 MG capsule, Take 1 each (1 capsule total) by mouth every morning., Disp: 90 capsule, Rfl: 3   terbinafine (LAMISIL) 250 MG tablet, Take 1 tablet (250 mg total) by mouth daily., Disp: 90 tablet, Rfl: 0  Social History   Tobacco Use  Smoking Status Never  Smokeless Tobacco Never    No Known Allergies Objective:  There were no vitals filed for this visit. There is no height or weight on file to calculate BMI. Constitutional Well developed. Well nourished.  Vascular Dorsalis pedis pulses palpable bilaterally. Posterior tibial pulses palpable bilaterally. Capillary refill normal to all digits.  No cyanosis or clubbing noted. Pedal hair growth normal.  Neurologic Normal speech. Oriented to person, place, and time. Epicritic sensation to light touch grossly present bilaterally.  Dermatologic Nails thickened elongated dystrophic toenail to right hallux x1.  Mild pain on palpation Skin dry skin without subjective component of itching noted to right plantar foot.  No fissuring noted.  No ulceration noted.  Orthopedic: Normal joint ROM without pain or crepitus bilaterally. No visible deformities. No bony tenderness.   Radiographs: None Assessment:   1. Long-term use of high-risk medication   2. Nail fungus   3. Onychomycosis due to dermatophyte   4. Xerosis of skin    Plan:  Patient was evaluated and treated and all questions answered.  Right hallux onychomycosis -Educated the patient on the etiology of onychomycosis and various treatment options associated with improving the fungal load.  I explained to the patient that there is 3 treatment options available to treat the onychomycosis including topical, p.o., laser treatment.  Patient elected to undergo p.o. options with Lamisil/terbinafine therapy.  In order for me to start the medication therapy, I explained to the  patient the importance of evaluating the liver and obtaining the liver function test.  Once the liver function test comes back normal I will start him on 37-month course of Lamisil therapy.  Patient understood all risk and would like to proceed with Lamisil therapy.  I have asked the patient to immediately stop the Lamisil therapy if she has any reactions to it and call the office or go to the emergency room right away.  Patient states understanding   Right plantar skin xerosis -I explained to the patient the etiology of xerosis and various treatment options were extensively discussed.  I explained to the patient the importance of maintaining moisturization of the skin with application of over-the-counter lotion such as Eucerin or Luciderm.  However she has failed over-the-counter treatment options and therefore both will benefit from ammonium lactate.  I have asked him to apply twice a day.  Ammonium lactate was sent to the pharmacy.   No follow-ups on file.

## 2020-09-12 DIAGNOSIS — M25562 Pain in left knee: Secondary | ICD-10-CM | POA: Insufficient documentation

## 2020-09-12 DIAGNOSIS — S80861A Insect bite (nonvenomous), right lower leg, initial encounter: Secondary | ICD-10-CM | POA: Diagnosis not present

## 2020-11-23 DIAGNOSIS — M4608 Spinal enthesopathy, sacral and sacrococcygeal region: Secondary | ICD-10-CM | POA: Diagnosis not present

## 2020-11-23 DIAGNOSIS — M9903 Segmental and somatic dysfunction of lumbar region: Secondary | ICD-10-CM | POA: Diagnosis not present

## 2020-11-23 DIAGNOSIS — M4607 Spinal enthesopathy, lumbosacral region: Secondary | ICD-10-CM | POA: Diagnosis not present

## 2020-11-23 DIAGNOSIS — M5127 Other intervertebral disc displacement, lumbosacral region: Secondary | ICD-10-CM | POA: Diagnosis not present

## 2020-11-25 DIAGNOSIS — M9903 Segmental and somatic dysfunction of lumbar region: Secondary | ICD-10-CM | POA: Diagnosis not present

## 2020-11-25 DIAGNOSIS — M5127 Other intervertebral disc displacement, lumbosacral region: Secondary | ICD-10-CM | POA: Diagnosis not present

## 2020-11-25 DIAGNOSIS — M4608 Spinal enthesopathy, sacral and sacrococcygeal region: Secondary | ICD-10-CM | POA: Diagnosis not present

## 2020-11-25 DIAGNOSIS — M4607 Spinal enthesopathy, lumbosacral region: Secondary | ICD-10-CM | POA: Diagnosis not present

## 2020-11-29 DIAGNOSIS — M4608 Spinal enthesopathy, sacral and sacrococcygeal region: Secondary | ICD-10-CM | POA: Diagnosis not present

## 2020-11-29 DIAGNOSIS — M9903 Segmental and somatic dysfunction of lumbar region: Secondary | ICD-10-CM | POA: Diagnosis not present

## 2020-11-29 DIAGNOSIS — M5127 Other intervertebral disc displacement, lumbosacral region: Secondary | ICD-10-CM | POA: Diagnosis not present

## 2020-11-29 DIAGNOSIS — M4607 Spinal enthesopathy, lumbosacral region: Secondary | ICD-10-CM | POA: Diagnosis not present

## 2020-12-02 ENCOUNTER — Other Ambulatory Visit: Payer: Self-pay

## 2020-12-02 DIAGNOSIS — I10 Essential (primary) hypertension: Secondary | ICD-10-CM

## 2020-12-02 MED ORDER — TRIAMTERENE-HCTZ 37.5-25 MG PO CAPS: 1.0000 | ORAL_CAPSULE | ORAL | 0 refills | Status: DC

## 2020-12-02 NOTE — Telephone Encounter (Signed)
30 day given of bp med pt needs appt for further refills.

## 2020-12-11 DIAGNOSIS — Z20822 Contact with and (suspected) exposure to covid-19: Secondary | ICD-10-CM | POA: Diagnosis not present

## 2020-12-23 ENCOUNTER — Ambulatory Visit (INDEPENDENT_AMBULATORY_CARE_PROVIDER_SITE_OTHER): Payer: BLUE CROSS/BLUE SHIELD | Admitting: Nurse Practitioner

## 2020-12-23 ENCOUNTER — Other Ambulatory Visit: Payer: Self-pay

## 2020-12-23 ENCOUNTER — Encounter: Payer: Self-pay | Admitting: Nurse Practitioner

## 2020-12-23 VITALS — BP 128/76 | HR 89 | Temp 98.8°F | Resp 14 | Ht 69.0 in | Wt 296.6 lb

## 2020-12-23 DIAGNOSIS — Z23 Encounter for immunization: Secondary | ICD-10-CM

## 2020-12-23 DIAGNOSIS — Z13220 Encounter for screening for lipoid disorders: Secondary | ICD-10-CM | POA: Diagnosis not present

## 2020-12-23 DIAGNOSIS — I10 Essential (primary) hypertension: Secondary | ICD-10-CM

## 2020-12-23 DIAGNOSIS — M545 Low back pain, unspecified: Secondary | ICD-10-CM

## 2020-12-23 DIAGNOSIS — Z130 Encounter for screening for diseases of the blood and blood-forming organs and certain disorders involving the immune mechanism: Secondary | ICD-10-CM | POA: Diagnosis not present

## 2020-12-23 DIAGNOSIS — Z131 Encounter for screening for diabetes mellitus: Secondary | ICD-10-CM | POA: Diagnosis not present

## 2020-12-23 MED ORDER — TRIAMTERENE-HCTZ 37.5-25 MG PO CAPS: 1.0000 | ORAL_CAPSULE | ORAL | 3 refills | Status: DC

## 2020-12-23 NOTE — Progress Notes (Signed)
BP 128/76   Pulse 89   Temp 98.8 F (37.1 C) (Oral)   Resp 14   Ht 5\' 9"  (1.753 m)   Wt 296 lb 9.6 oz (134.5 kg)   SpO2 99%   BMI 43.80 kg/m    Subjective:    Patient ID: Ann Morgan, female    DOB: April 28, 1970, 50 y.o.   MRN: [id number]  HPI: Ann Morgan is a 50 y.o. female  Chief Complaint  Patient presents with   Hypertension    Follow up   Hypertension:  She is currently taking triamterene-hydrochlorothiazide 37.5-25 mg daily.  Her blood pressure today is 128/76.  She does not take her blood pressure regularly at home.  Discussed taking her blood pressure at home and keeping a record.  She denies any chest pain or shortness of breath  Low back pain:  She says about a month ago she started having back pain.  She says that she thinks it was from carrying heavy tables and chairs.  She says the pain was sharp and she had a hard time sleeping, turning in bed.  She denies any lower extremity numbness or tingling.  No issues with incontinence.  She denies any radiation of the pain.  She says she went to a chiropractor who did not do an xray but told her she has a bulging disc.  She did multiple adjustments with him and other treatments.  She says that she still feels it on occasion but no pain currently.  Discussed getting xray and if positive referral to orthopedic. She is agreeable with plan.   Relevant past medical, surgical, family and social history reviewed and updated as indicated. Interim medical history since our last visit reviewed. Allergies and medications reviewed and updated.  Review of Systems  Constitutional: Negative for fever or weight change.  Respiratory: Negative for cough and shortness of breath.   Cardiovascular: Negative for chest pain or palpitations.  Gastrointestinal: Negative for abdominal pain, no bowel changes.  Musculoskeletal: Negative for gait problem or joint swelling. Positive for lower back pain Skin: Negative for rash.   Neurological: Negative for dizziness, positive for headache.  No other specific complaints in a complete review of systems (except as listed in HPI above).      Objective:    BP 128/76   Pulse 89   Temp 98.8 F (37.1 C) (Oral)   Resp 14   Ht 5\' 9"  (1.753 m)   Wt 296 lb 9.6 oz (134.5 kg)   SpO2 99%   BMI 43.80 kg/m   Wt Readings from Last 3 Encounters:  12/23/20 296 lb 9.6 oz (134.5 kg)  05/06/20 299 lb (135.6 kg)  10/08/19 293 lb 14.4 oz (133.3 kg)    Physical Exam  Constitutional: Patient appears well-developed and well-nourished. Obese No distress.  HEENT: head atraumatic, normocephalic, pupils equal and reactive to light, neck supple Cardiovascular: Normal rate, regular rhythm and normal heart sounds.  No murmur heard. No BLE edema. Pulmonary/Chest: Effort normal and breath sounds normal. No respiratory distress. Abdominal: Soft.  There is no tenderness. Musculoskeletal: No decrease in ROM Psychiatric: Patient has a normal mood and affect. behavior is normal. Judgment and thought content normal.   Results for orders placed or performed in visit on 09/02/20  Hepatic function panel  Result Value Ref Range   Total Protein 7.0 6.0 - 8.5 g/dL   Albumin 4.2 3.8 - 4.8 g/dL   Bilirubin Total 0.2 0.0 - 1.2 mg/dL   Bilirubin, Direct  0.10 0.00 - 0.40 mg/dL   Alkaline Phosphatase 89 44 - 121 IU/L   AST 20 0 - 40 IU/L   ALT 15 0 - 32 IU/L      Assessment & Plan:   1. Essential hypertension  - CBC with Differential/Platelet - COMPLETE METABOLIC PANEL WITH GFR - triamterene-hydrochlorothiazide (DYAZIDE) 37.5-25 MG capsule; Take 1 each (1 capsule total) by mouth every morning.  Dispense: 90 capsule; Refill: 3  2. Midline low back pain without sciatica, unspecified chronicity  - DG Lumbar Spine Complete; Future  3. Need for influenza vaccination  - Flu Vaccine QUAD 6+ mos PF IM (Fluarix Quad PF)  4. Screening for lipid disorders  - Lipid panel  5. Screening for  deficiency anemia  - CBC with Differential/Platelet  6. Screening for diabetes mellitus  - CBC with Differential/Platelet - Hemoglobin A1c   Follow up plan: Return in about 6 months (around 06/23/2021) for follow up.

## 2020-12-24 LAB — COMPLETE METABOLIC PANEL WITH GFR
AG Ratio: 1.4 (calc) (ref 1.0–2.5)
ALT: 16 U/L (ref 6–29)
AST: 22 U/L (ref 10–35)
Albumin: 3.9 g/dL (ref 3.6–5.1)
Alkaline phosphatase (APISO): 70 U/L (ref 31–125)
BUN: 14 mg/dL (ref 7–25)
CO2: 30 mmol/L (ref 20–32)
Calcium: 9.3 mg/dL (ref 8.6–10.2)
Chloride: 103 mmol/L (ref 98–110)
Creat: 0.88 mg/dL (ref 0.50–0.99)
Globulin: 2.7 g/dL (calc) (ref 1.9–3.7)
Glucose, Bld: 89 mg/dL (ref 65–99)
Potassium: 3.5 mmol/L (ref 3.5–5.3)
Sodium: 138 mmol/L (ref 135–146)
Total Bilirubin: 0.3 mg/dL (ref 0.2–1.2)
Total Protein: 6.6 g/dL (ref 6.1–8.1)
eGFR: 81 mL/min/{1.73_m2} (ref >=60)

## 2020-12-24 LAB — LIPID PANEL
Cholesterol: 163 mg/dL (ref <200)
HDL: 56 mg/dL (ref >=50)
LDL Cholesterol (Calc): 93 mg/dL (calc)
Non-HDL Cholesterol (Calc): 107 mg/dL (calc) (ref <130)
Total CHOL/HDL Ratio: 2.9 (calc) (ref <5.0)
Triglycerides: 62 mg/dL (ref <150)

## 2020-12-24 LAB — CBC WITH DIFFERENTIAL/PLATELET
Absolute Monocytes: 470 cells/uL (ref 200–950)
Basophils Absolute: 43 cells/uL (ref 0–200)
Basophils Relative: 0.7 %
Eosinophils Absolute: 128 cells/uL (ref 15–500)
Eosinophils Relative: 2.1 %
HCT: 35.3 % (ref 35.0–45.0)
Hemoglobin: 11.8 g/dL (ref 11.7–15.5)
Lymphs Abs: 2129 cells/uL (ref 850–3900)
MCH: 29.4 pg (ref 27.0–33.0)
MCHC: 33.4 g/dL (ref 32.0–36.0)
MCV: 87.8 fL (ref 80.0–100.0)
MPV: 9.6 fL (ref 7.5–12.5)
Monocytes Relative: 7.7 %
Neutro Abs: 3331 cells/uL (ref 1500–7800)
Neutrophils Relative %: 54.6 %
Platelets: 360 10*3/uL (ref 140–400)
RBC: 4.02 10*6/uL (ref 3.80–5.10)
RDW: 13.7 % (ref 11.0–15.0)
Total Lymphocyte: 34.9 %
WBC: 6.1 10*3/uL (ref 3.8–10.8)

## 2020-12-24 LAB — HEMOGLOBIN A1C
Hgb A1c MFr Bld: 5.4 % of total Hgb (ref <5.7)
Mean Plasma Glucose: 108 mg/dL
eAG (mmol/L): 6 mmol/L

## 2020-12-28 DIAGNOSIS — H35033 Hypertensive retinopathy, bilateral: Secondary | ICD-10-CM | POA: Diagnosis not present

## 2021-01-25 ENCOUNTER — Encounter: Payer: Self-pay | Admitting: Nurse Practitioner

## 2021-04-10 ENCOUNTER — Other Ambulatory Visit: Payer: Self-pay | Admitting: Obstetrics and Gynecology

## 2021-04-10 DIAGNOSIS — Z1231 Encounter for screening mammogram for malignant neoplasm of breast: Secondary | ICD-10-CM

## 2021-05-12 ENCOUNTER — Other Ambulatory Visit: Payer: Self-pay

## 2021-05-12 ENCOUNTER — Ambulatory Visit
Admission: RE | Admit: 2021-05-12 | Discharge: 2021-05-12 | Disposition: A | Discharge status: Home / Self Care | Payer: BLUE CROSS/BLUE SHIELD | Source: Ambulatory Visit | Attending: Obstetrics and Gynecology | Admitting: Obstetrics and Gynecology

## 2021-05-12 DIAGNOSIS — Z1231 Encounter for screening mammogram for malignant neoplasm of breast: Secondary | ICD-10-CM | POA: Diagnosis not present

## 2021-05-12 DIAGNOSIS — Z1339 Encounter for screening examination for other mental health and behavioral disorders: Secondary | ICD-10-CM | POA: Diagnosis not present

## 2021-05-12 DIAGNOSIS — Z1331 Encounter for screening for depression: Secondary | ICD-10-CM | POA: Diagnosis not present

## 2021-05-12 DIAGNOSIS — N8501 Benign endometrial hyperplasia: Secondary | ICD-10-CM | POA: Diagnosis not present

## 2021-05-12 DIAGNOSIS — S83289A Other tear of lateral meniscus, current injury, unspecified knee, initial encounter: Secondary | ICD-10-CM | POA: Insufficient documentation

## 2021-05-12 DIAGNOSIS — M25569 Pain in unspecified knee: Secondary | ICD-10-CM | POA: Insufficient documentation

## 2021-05-12 DIAGNOSIS — Z01419 Encounter for gynecological examination (general) (routine) without abnormal findings: Secondary | ICD-10-CM | POA: Diagnosis not present

## 2021-05-12 DIAGNOSIS — M719 Bursopathy, unspecified: Secondary | ICD-10-CM | POA: Insufficient documentation

## 2021-06-07 ENCOUNTER — Other Ambulatory Visit: Payer: Self-pay

## 2021-06-07 ENCOUNTER — Ambulatory Visit (INDEPENDENT_AMBULATORY_CARE_PROVIDER_SITE_OTHER): Payer: BC Managed Care – PPO

## 2021-06-07 ENCOUNTER — Ambulatory Visit (INDEPENDENT_AMBULATORY_CARE_PROVIDER_SITE_OTHER): Payer: BC Managed Care – PPO | Admitting: Orthopedic Surgery

## 2021-06-07 DIAGNOSIS — M25561 Pain in right knee: Secondary | ICD-10-CM

## 2021-06-07 DIAGNOSIS — M1711 Unilateral primary osteoarthritis, right knee: Secondary | ICD-10-CM

## 2021-06-08 NOTE — Progress Notes (Signed)
? ?Office Visit Note ?  ?Patient: Ann Morgan           ?Date of Birth: April 12, 1970           ?MRN: [id number] ?Visit Date: 06/07/2021 ?Requested by: Bo Merino, FNP ?[address] ?[address] ?[place],  [place] [address] ?PCP: Bo Merino, FNP ? ?Subjective: ?Chief Complaint  ?Patient presents with  ?? Right Knee - Pain  ? ? ?HPI: Ann Morgan is a 51 year old female who presents complaining of right knee pain.  She has had right knee pain over the last month and she has begun to notice increased swelling in her knee without any history of injury.  She has started a workout program over the last 2 months where she does a lot of circuit training and walking for cardio.  She has to do jumping jacks on occasion states these have been painful so she stopped doing them.  She describes more of a discomfort and states she does not really have a lot of severe pain but is concerned about her swelling.  She notes pain is worse with going downstairs.  She also has stiffness with sitting for long periods of time.  Denies any instability of the knee, prior right knee pain, groin pain, radicular pain.  No locking mechanical symptoms.  She does note some occasional numbness in the anterior thigh that has been going on for several years but this does not seem related to her knee pain.  No history of diabetes or smoking.  She does have history of prior left knee surgery about 12 years ago but has never had surgery on her right knee.  She has not really tried any over-the-counter medications.  She has not tried ice or resting. ? ?Patient also reports that she started some tennis lessons and had a popping sensation in her right elbow with her tennis lesson last weekend.  She has been doing stretching exercises and her symptoms have completely resolved over the last week to the point where she is having no current elbow discomfort, pain, stiffness, symptoms in general.  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 ?             ?ROS: All systems reviewed are negative as they relate to the chief complaint within the history of present illness.  Patient denies  fevers or chills. ? ? ?Assessment & Plan: ?Visit Diagnoses:  ?1. Primary osteoarthritis of right knee   ? ? ?Plan: Patient is a 51 year old female who presents for evaluation of right knee pain.  She has had increased right knee discomfort and swelling over the last months after starting a workout program about 2 months ago.  She does have a large effusion on exam today.  She has mild arthritis of the medial compartment on radiographs taken today but otherwise her x-rays are unremarkable.  Discussed options available to patient.  After lengthy discussion of options, she would like to try right knee aspiration with injection.  Due to the large accumulation of fluid, would recommend MRI scan of the right knee as a next step if her pain/discomfort/effusion returns.  Patient understands and agreed with this plan.  She had 40 cc of nonpurulent synovial fluid aspirated from the right knee and tolerated the injection well.  Follow-up as needed if symptoms do not improve. ? ?Follow-Up Instructions: No follow-ups on file.  ? ?Orders:  ?Orders Placed This Encounter  ?Procedures  ?? XR KNEE 3 VIEW  RIGHT  ? ?No orders of the defined types were placed in this encounter. ? ? ? ? Procedures: ?Large Joint Inj: R knee on 06/07/2021 4:48 PM ?Indications: diagnostic evaluation, joint swelling and pain ?Details: 18 G 1.5 in needle, superolateral approach ? ?Arthrogram: No ? ?Medications: 5 mL lidocaine 1 %; 40 mg methylPREDNISolone acetate 40 MG/ML; 4 mL bupivacaine 0.25 % ?Aspirate: 40 mL ?Outcome: tolerated well, no immediate complications ?Procedure, treatment alternatives, risks and benefits explained, specific risks discussed. Consent was given by the patient. Immediately prior to procedure a time out was called to verify the correct patient, procedure, equipment,  support staff and site/side marked as required. Patient was prepped and draped in the usual sterile fashion.  ? ? ? ? ?Clinical Data: ?No additional findings. ? ?Objective: ?Vital Signs: There were no vitals taken for this visit. ? ?Physical Exam:  ? ?Constitutional: Patient appears well-developed ?HEENT:  ?Head: Normocephalic ?Eyes:EOM are normal ?Neck: Normal range of motion ?Cardiovascular: Normal rate ?Pulmonary/chest: Effort normal ?Neurologic: Patient is alert ?Skin: Skin is warm ?Psychiatric: Patient has normal mood and affect ? ? ?Ortho Exam: Ortho exam demonstrates right knee with large effusion.  Mild to moderate tenderness over the medial joint line.  No tenderness over the lateral joint line.  No increased laxity to anterior posterior drawer.  Negative Lachman exam.  No varus or valgus laxity at 0 or 30 degrees.  No pain with hip range of motion.  Negative Stinchfield sign.  Patient is able to perform straight leg raise without extensor lag. ? ?Specialty Comments:  ?No specialty comments available. ? ?Imaging: ?No results found. ? ? ?PMFS History: ?Patient Active Problem List  ? Diagnosis Date Noted  ?? Asthma 09/02/2020  ?? Complications affecting other specified body systems, hypertension 09/02/2020  ?? Muscle weakness 09/02/2020  ?? Traumatic complete tear of right rotator cuff 04/10/2019  ?? Epidermal cyst of neck 02/17/2018  ?? Screening for lipid disorders 07/19/2017  ?? Skin abnormalities 07/15/2017  ?? Endometrial hyperplasia without atypia, simple 05/17/2017  ?? Allergic rhinitis 03/07/2017  ?? Patellofemoral pain syndrome of both knees 02/04/2017  ?? Vasovagal syncope 02/04/2017  ?? Glucose intolerance 01/01/2017  ?? Overactive bladder 01/01/2017  ?? Body mass index (BMI) of 45.0-49.9 in adult [facility]) 09/19/2015  ?? Injury of left wrist 05/09/2015  ?? Varicose veins of both lower extremities with pain 10/27/2014  ?? Breast cyst 12/11/2012  ?? Abnormal mammogram 06/09/2012  ?? Premenopausal  menorrhagia 04/28/2012  ?? Polycystic ovaries 03/29/2010  ?? Class 3 severe obesity due to excess calories in adult [facility]) 03/29/2010  ?? Essential hypertension 03/29/2010  ? ?Past Medical History:  ?Diagnosis Date  ?? Abnormal Pap smear of cervix   ?? Asthma   ?? Endometrial hyperplasia   ?? Hypertension 2004  ?? Irregular menses   ?? Mild intermittent asthma with acute exacerbation 10/19/2013  ? Late 2013 with a cough that lasted approximately one month.  PFTs showed FEV1 of 83%  Last Assessment & Plan:  Reports that since she was prescribed albuterol, she has used it maybe 1x to see how she would respond to it which seemed to help. However, she still has some coughing issues but thinks it is manageable. No issues with high pollen season.   Plan: - Continue with albuterol PRN.  - She will  ?? Obesity   ?  ?Family History  ?Problem Relation Age of Onset  ?? Hypertension Mother   ?? Diabetes Brother   ?? Hypertension Brother   ?? Breast  cancer Maternal Grandmother 50  ?  ?Past Surgical History:  ?Procedure Laterality Date  ?? BREAST CYST ASPIRATION Left 2015  ? benign  ?? KNEE SURGERY Left jan 2011  ? orthoscopic  ?? SHOULDER ARTHROSCOPY WITH SUBACROMIAL DECOMPRESSION, ROTATOR CUFF REPAIR AND BICEP TENDON REPAIR Right 12/25/2018  ? Procedure: right shoulder arthroscopy, biceps tendon release with tenodesis, three tendon rotator cuff repair;  Surgeon: Meredith Pel, MD;  Location: [facility];  Service: Orthopedics;  Laterality: Right;  ? ?Social History  ? ?Occupational History  ?? Not on file  ?Tobacco Use  ?? Smoking status: Never  ?? Smokeless tobacco: Never  ?Vaping Use  ?? Vaping Use: Never used  ?Substance and Sexual Activity  ?? Alcohol use: No  ?? Drug use: No  ?? Sexual activity: Yes  ?  Partners: Male  ?  Birth control/protection: None  ? ? ? ? ? ?

## 2021-06-10 ENCOUNTER — Encounter: Payer: Self-pay | Admitting: Orthopedic Surgery

## 2021-06-11 ENCOUNTER — Encounter: Payer: Self-pay | Admitting: Orthopedic Surgery

## 2021-06-11 MED ORDER — METHYLPREDNISOLONE ACETATE 40 MG/ML IJ SUSP
40.0000 mg | INTRAMUSCULAR | Status: AC | PRN
  Administered 2021-06-07: 40 mg via INTRA_ARTICULAR

## 2021-06-11 MED ORDER — LIDOCAINE HCL 1 % IJ SOLN
5.0000 mL | INTRAMUSCULAR | Status: AC | PRN
Start: 2021-06-07 — End: 2021-06-07
  Administered 2021-06-07: 5 mL

## 2021-06-11 MED ORDER — BUPIVACAINE HCL 0.25 % IJ SOLN
4.0000 mL | INTRAMUSCULAR | Status: AC | PRN
  Administered 2021-06-07: 4 mL via INTRA_ARTICULAR

## 2021-06-22 ENCOUNTER — Ambulatory Visit (INDEPENDENT_AMBULATORY_CARE_PROVIDER_SITE_OTHER): Payer: BC Managed Care – PPO | Admitting: Nurse Practitioner

## 2021-06-22 ENCOUNTER — Other Ambulatory Visit: Payer: Self-pay

## 2021-06-22 ENCOUNTER — Encounter: Payer: Self-pay | Admitting: Nurse Practitioner

## 2021-06-22 VITALS — BP 128/74 | HR 88 | Temp 98.4°F | Resp 16 | Ht 69.0 in | Wt 273.6 lb

## 2021-06-22 DIAGNOSIS — I83813 Varicose veins of bilateral lower extremities with pain: Secondary | ICD-10-CM | POA: Diagnosis not present

## 2021-06-22 DIAGNOSIS — I10 Essential (primary) hypertension: Secondary | ICD-10-CM | POA: Diagnosis not present

## 2021-06-22 NOTE — Progress Notes (Signed)
? ?BP 128/74   Pulse 88   Temp 98.4 ?F (36.9 ?C) (Oral)   Resp 16   Ht 5' 9" (1.753 m)   Wt 273 lb 9.6 oz (124.1 kg)   SpO2 99%   BMI 40.40 kg/m?   ? ?Subjective:  ? ? Patient ID: Ann Morgan, female    DOB: 11-14-1970, 51 y.o.   MRN: [id number] ? ?HPI: ?Ann Morgan is a 51 y.o. female ? ?Chief Complaint  ?Patient presents with  ? Hypertension  ?  6 month follow up  ? ?Hypertension: She is currently taking Dyazide 37.5-25 mg daily. She says she does take it every day. She does not routinely check her blood pressure but she did check it a few times 111/68, 120/78. She has been working on eating healthier and getting in physical activity.She is mostly walking and doing core exercises.  She would like to eventually get of blood pressure medication if she can.  Her blood pressure today is 128/74. She denies any chest pain, shortness of breath, headaches or blurred vision.  ? ?Varicose veins:  She has had varicose veins for years. She did see a vein specialist before the pandemic but did not go back.  She says the ones on her right leg are causing some pain and would like to have them treated.  Placed referral to [facility].  ? ?Relevant past medical, surgical, family and social history reviewed and updated as indicated. Interim medical history since our last visit reviewed. ?Allergies and medications reviewed and updated. ? ?Review of Systems ? ?Constitutional: Negative for fever or weight change.  ?Respiratory: Negative for cough and shortness of breath.   ?Cardiovascular: Negative for chest pain or palpitations.  ?Gastrointestinal: Negative for abdominal pain, no bowel changes.  ?Musculoskeletal: Negative for gait problem or joint swelling.  ?Skin: Negative for rash. Positive for varicose veins ?Neurological: Negative for dizziness or headache.  ?No other specific complaints in a complete review of systems (except as listed in HPI above).  ? ?   ?Objective:  ?  ?BP 128/74    Pulse 88   Temp 98.4 ?F (36.9 ?C) (Oral)   Resp 16   Ht 5' 9" (1.753 m)   Wt 273 lb 9.6 oz (124.1 kg)   SpO2 99%   BMI 40.40 kg/m?   ?Wt Readings from Last 3 Encounters:  ?06/22/21 273 lb 9.6 oz (124.1 kg)  ?12/23/20 296 lb 9.6 oz (134.5 kg)  ?05/06/20 299 lb (135.6 kg)  ?  ?Physical Exam ? ?Constitutional: Patient appears well-developed and well-nourished. Obese  No distress.  ?HEENT: head atraumatic, normocephalic, pupils equal and reactive to light, neck supple ?Cardiovascular: Normal rate, regular rhythm and normal heart sounds.  No murmur heard. No BLE edema. ?Pulmonary/Chest: Effort normal and breath sounds normal. No respiratory distress. ?Abdominal: Soft.  There is no tenderness. ?Lower extremities: bilateral varicose veins ?Psychiatric: Patient has a normal mood and affect. behavior is normal. Judgment and thought content normal.  ?Results for orders placed or performed in visit on 12/23/20  ?CBC with Differential/Platelet  ?Result Value Ref Range  ? WBC 6.1 3.8 - 10.8 Thousand/uL  ? RBC 4.02 3.80 - 5.10 Million/uL  ? Hemoglobin 11.8 11.7 - 15.5 g/dL  ? HCT 35.3 35.0 - 45.0 %  ? MCV 87.8 80.0 - 100.0 fL  ? MCH 29.4 27.0 - 33.0 pg  ? MCHC 33.4 32.0 - 36.0 g/dL  ? RDW 13.7 11.0 - 15.0 %  ? Platelets 360 140 -  400 Thousand/uL  ? MPV 9.6 7.5 - 12.5 fL  ? Neutro Abs 3,331 1,500 - 7,800 cells/uL  ? Lymphs Abs 2,129 850 - 3,900 cells/uL  ? Absolute Monocytes 470 200 - 950 cells/uL  ? Eosinophils Absolute 128 15 - 500 cells/uL  ? Basophils Absolute 43 0 - 200 cells/uL  ? Neutrophils Relative % 54.6 %  ? Total Lymphocyte 34.9 %  ? Monocytes Relative 7.7 %  ? Eosinophils Relative 2.1 %  ? Basophils Relative 0.7 %  ?COMPLETE METABOLIC PANEL WITH GFR  ?Result Value Ref Range  ? Glucose, Bld 89 65 - 99 mg/dL  ? BUN 14 7 - 25 mg/dL  ? Creat 0.88 0.50 - 0.99 mg/dL  ? eGFR 81 > OR = 60 mL/min/1.33m  ? BUN/Creatinine Ratio NOT APPLICABLE 6 - 22 (calc)  ? Sodium 138 135 - 146 mmol/L  ? Potassium 3.5 3.5 - 5.3 mmol/L   ? Chloride 103 98 - 110 mmol/L  ? CO2 30 20 - 32 mmol/L  ? Calcium 9.3 8.6 - 10.2 mg/dL  ? Total Protein 6.6 6.1 - 8.1 g/dL  ? Albumin 3.9 3.6 - 5.1 g/dL  ? Globulin 2.7 1.9 - 3.7 g/dL (calc)  ? AG Ratio 1.4 1.0 - 2.5 (calc)  ? Total Bilirubin 0.3 0.2 - 1.2 mg/dL  ? Alkaline phosphatase (APISO) 70 31 - 125 U/L  ? AST 22 10 - 35 U/L  ? ALT 16 6 - 29 U/L  ?Lipid panel  ?Result Value Ref Range  ? Cholesterol 163 <200 mg/dL  ? HDL 56 > OR = 50 mg/dL  ? Triglycerides 62 <150 mg/dL  ? LDL Cholesterol (Calc) 93 mg/dL (calc)  ? Total CHOL/HDL Ratio 2.9 <5.0 (calc)  ? Non-HDL Cholesterol (Calc) 107 <130 mg/dL (calc)  ?Hemoglobin A1c  ?Result Value Ref Range  ? Hgb A1c MFr Bld 5.4 <5.7 % of total Hgb  ? Mean Plasma Glucose 108 mg/dL  ? eAG (mmol/L) 6.0 mmol/L  ? ?   ?Assessment & Plan:  ? ?1. Essential hypertension ?-continue with current treatment plan ?- CBC with Differential/Platelet ?- COMPLETE METABOLIC PANEL WITH GFR ? ?2. Varicose veins of both lower extremities with pain ? ?- Ambulatory referral to Vascular Surgery  ? ?Follow up plan: ?Return in about 6 months (around 12/22/2021) for follow up. ? ? ? ? ? ?

## 2021-06-23 LAB — COMPLETE METABOLIC PANEL WITH GFR
AG Ratio: 1.4 (calc) (ref 1.0–2.5)
ALT: 17 U/L (ref 6–29)
AST: 24 U/L (ref 10–35)
Albumin: 4.4 g/dL (ref 3.6–5.1)
Alkaline phosphatase (APISO): 71 U/L (ref 31–125)
BUN/Creatinine Ratio: 16 (calc) (ref 6–22)
BUN: 22 mg/dL (ref 7–25)
CO2: 27 mmol/L (ref 20–32)
Calcium: 10.1 mg/dL (ref 8.6–10.2)
Chloride: 102 mmol/L (ref 98–110)
Creat: 1.41 mg/dL — ABNORMAL HIGH (ref 0.50–0.99)
Globulin: 3.1 g/dL (calc) (ref 1.9–3.7)
Glucose, Bld: 84 mg/dL (ref 65–99)
Potassium: 3.7 mmol/L (ref 3.5–5.3)
Sodium: 140 mmol/L (ref 135–146)
Total Bilirubin: 0.6 mg/dL (ref 0.2–1.2)
Total Protein: 7.5 g/dL (ref 6.1–8.1)
eGFR: 46 mL/min/{1.73_m2} — ABNORMAL LOW (ref >=60)

## 2021-06-23 LAB — CBC WITH DIFFERENTIAL/PLATELET
Absolute Monocytes: 736 cells/uL (ref 200–950)
Basophils Absolute: 18 cells/uL (ref 0–200)
Basophils Relative: 0.2 %
Eosinophils Absolute: 28 cells/uL (ref 15–500)
Eosinophils Relative: 0.3 %
HCT: 42.8 % (ref 35.0–45.0)
Hemoglobin: 13.6 g/dL (ref 11.7–15.5)
Lymphs Abs: 1076 cells/uL (ref 850–3900)
MCH: 28.9 pg (ref 27.0–33.0)
MCHC: 31.8 g/dL — ABNORMAL LOW (ref 32.0–36.0)
MCV: 91.1 fL (ref 80.0–100.0)
MPV: 10 fL (ref 7.5–12.5)
Monocytes Relative: 8 %
Neutro Abs: 7342 cells/uL (ref 1500–7800)
Neutrophils Relative %: 79.8 %
Platelets: 317 10*3/uL (ref 140–400)
RBC: 4.7 10*6/uL (ref 3.80–5.10)
RDW: 13.1 % (ref 11.0–15.0)
Total Lymphocyte: 11.7 %
WBC: 9.2 10*3/uL (ref 3.8–10.8)

## 2021-07-10 ENCOUNTER — Encounter: Payer: Self-pay | Admitting: Nurse Practitioner

## 2021-08-23 ENCOUNTER — Encounter: Payer: Self-pay | Admitting: Family Medicine

## 2021-08-23 ENCOUNTER — Ambulatory Visit (INDEPENDENT_AMBULATORY_CARE_PROVIDER_SITE_OTHER): Payer: BC Managed Care – PPO | Admitting: Family Medicine

## 2021-08-23 ENCOUNTER — Telehealth: Payer: Self-pay

## 2021-08-23 ENCOUNTER — Ambulatory Visit: Payer: Self-pay

## 2021-08-23 VITALS — BP 132/80 | HR 80 | Resp 16 | Ht 69.0 in | Wt 277.0 lb

## 2021-08-23 DIAGNOSIS — R1032 Left lower quadrant pain: Secondary | ICD-10-CM

## 2021-08-23 DIAGNOSIS — R109 Unspecified abdominal pain: Secondary | ICD-10-CM | POA: Insufficient documentation

## 2021-08-23 LAB — POCT URINE PREGNANCY: Preg Test, Ur: NEGATIVE

## 2021-08-23 NOTE — Progress Notes (Signed)
    SUBJECTIVE:   CHIEF COMPLAINT / HPI:   ABDOMINAL ISSUES - endorsing intermittent LLQ abdominal pain since yesterday, sometimes dull or sharp in character.  - last BM yesterday morning, 2-3 on BSC. Typically doesn't have BM daily. Usually one every 2-3 days. Type 1-3 on BSC. - hasn't eaten much today, few crackers and sips of water - hasn't drank as much water since starting exercise program recently. Normally will drink around 1 gallon of water daily, now about a half gallon.  - denies new or suspicious foods. No travel. Did eat steak last night but was fully cooked. - no sick contacts.  - is sexually active - LMP 2-3 months ago, has h/o irregular periods, infertility. - denies abnormal vaginal discharge, rashes, ulcers, dysuria. - Colonoscopy 07/2020 with benign rectal polyps  Duration: yesterday Nature: dull, sharp sometimes Location: LLQ  Radiation: no Frequency: intermittent Alleviating factors: rest, lying Aggravating factors: movement Treatments attempted: exlax Constipation: intermittent Diarrhea: no Mucous in the stool: no Heartburn: no Bloating: yes, some Flatulence: yes, some Nausea: no Vomiting: yes Episodes of vomit/day: only once yesterday Melena or hematochezia: no Rash: no Jaundice: no Fever: no, has had some chills Weight loss: no   OBJECTIVE:   BP 132/80   Pulse 80   Resp 16   Ht '5\' 9"'$  (1.753 m)   Wt 277 lb (125.6 kg)   SpO2 98%   BMI 40.91 kg/m   Gen: well appearing, in NAD Card: RRR Lungs: CTAB Abd: soft, TTP bilateral lower quadrants, L>R and suprapubically. Negative murphy sign. Hypoactive BS. No guarding. Some rebound tenderness in LLQ.  Ext: WWP, no edema  Limited Abdominal US Findings: Liver normal appearing. Kidneys normal appearing, no hydronephrosis bilaterally. Gallbladder not well visualized due to overlying bowel gas. Prominent non-loculated L ovarian cyst measuring 8.4cm without visible septations or surrounding hyperemia.  R ovary not well visualized. Uterine hyperplasia seen. Bladder unremarkable. Impression: large nonloculated cyst measuring 8.4cm without surrounding hyperemia. Uterine hyperplasia. R ovary not well visualized.  ASSESSMENT/PLAN:   Abdominal pain Unclear etiology. UPreg negative. Does have h/o constipation with decrease in oral hydration recently, likely contributing. Recommend increasing oral hydration and starting miralax and titrate for effect. Also with large non-loculated L ovarian cyst seen on bedside US in area of pain, will obtain stat pelvic US to assess for possible torsion. Also obtaining labs to assess for other contributors, infection. Less likely diverticulitis, bowel obstruction or ischemia, urinary abnormality. F/u pending results.      Myles Gip, DO

## 2021-08-23 NOTE — Patient Instructions (Signed)
It was great to see you!  Our plans for today:  - Take one capful of miralax per day. If you are not achieving good bowel movements after a few days, increase to 2 capfuls daily and so on until you get regular soft bowel movements. - We are getting an ultrasound, someone will call you with this appointment.  We are checking some labs today, we will release these results to your MyChart.  Take care and seek immediate care sooner if you develop any concerns.   Dr. Ky Barban

## 2021-08-23 NOTE — Assessment & Plan Note (Signed)
Unclear etiology. UPreg negative. Does have h/o constipation with decrease in oral hydration recently, likely contributing. Recommend increasing oral hydration and starting miralax and titrate for effect. Also with large non-loculated L ovarian cyst seen on bedside US in area of pain, will obtain stat pelvic US to assess for possible torsion. Also obtaining labs to assess for other contributors, infection. Less likely diverticulitis, bowel obstruction or ischemia, urinary abnormality. F/u pending results.

## 2021-08-23 NOTE — Telephone Encounter (Signed)
  Chief Complaint: abd pain Symptoms: cold and hot flashes, weak, emesis, nausea, intermittent mild abdominal pain  Frequency: since yesterday Pertinent Negatives: Patient denies back pain, no radiating pain, diarrhea, fever, urination pain Disposition: '[]'$ ED /'[]'$ Urgent Care (no appt availability in office) / '[x]'$ Appointment(In office/virtual)/ '[]'$  [facility]/ '[]'$ Home Care/ '[]'$ Refused Recommended Disposition /'[]'$  Mobile Bus/ '[]'$  Follow-up with PCP Additional Notes: in office appt with  Today with provider  Reason for Disposition  [1] MILD pain (e.g., does not interfere with normal activities) AND [2] pain comes and goes (cramps) AND [3] present > 48 hours  (Exception: this same abdominal pain is a chronic symptom recurrent or ongoing AND present > 4 weeks)  Answer Assessment - Initial Assessment Questions 1. LOCATION: "Where does it hurt?"      Left lower abdomen 2. RADIATION: "Does the pain shoot anywhere else?" (e.g., chest, back)     no 3. ONSET: "When did the pain begin?" (e.g., minutes, hours or days ago)      yesterday 4. SUDDEN: "Gradual or sudden onset?"     sudden 5. PATTERN "Does the pain come and go, or is it constant?"    - If constant: "Is it getting better, staying the same, or worsening?"      (Note: Constant means the pain never goes away completely; most serious pain is constant and it progresses)     - If intermittent: "How long does it last?" "Do you have pain now?"     (Note: Intermittent means the pain goes away completely between bouts)     Comes and goes depending on position-same-- no mild 6. SEVERITY: "How bad is the pain?"  (e.g., Scale 1-10; mild, moderate, or severe)   - MILD (1-3): doesn't interfere with normal activities, abdomen soft and not tender to touch    - MODERATE (4-7): interferes with normal activities or awakens from sleep, abdomen tender to touch    - SEVERE (8-10): excruciating pain, doubled over, unable to do any normal  activities      Mild-sharp- cramping and aching 7. RECURRENT SYMPTOM: "Have you ever had this type of stomach pain before?" If Yes, ask: "When was the last time?" and "What happened that time?"      Yes- gas went away with activity 8. CAUSE: "What do you think is causing the stomach pain?"     Potentially bad food, gas, constipation last BM :yesterday 9. RELIEVING/AGGRAVATING FACTORS: "What makes it better or worse?" (e.g., movement, antacids, bowel movement)     Movement makes worse and certain alleviate decrease the pain 10. OTHER SYMPTOMS: "Do you have any other symptoms?" (e.g., back pain, diarrhea, fever, urination pain, vomiting)       Cold and hot chills, weak, vomiting, nausea,  11. PREGNANCY: "Is there any chance you are pregnant?" "When was your last menstrual period?"       No-LMP in 2 -3 months  Protocols used: Abdominal Pain - [facility]

## 2021-08-23 NOTE — Telephone Encounter (Signed)
I called this patient to inform her that she has been scheduled to have her [place] tomorrow (08/24/21) @ 8:30am, but there was no answer.   A message was left for her informing her to arrive at [facility] by 8am with a full bladder ( 32oz 1 hr before without urinating).   Patient was encouraged to call Centralized Scheduling at [phone] if this appt date and time is not good for her and to reach out to [facility] at [phone] if she should have any additional questions.

## 2021-08-24 ENCOUNTER — Other Ambulatory Visit: Payer: Self-pay

## 2021-08-24 ENCOUNTER — Emergency Department (HOSPITAL_BASED_OUTPATIENT_CLINIC_OR_DEPARTMENT_OTHER): Payer: BC Managed Care – PPO

## 2021-08-24 ENCOUNTER — Encounter (HOSPITAL_BASED_OUTPATIENT_CLINIC_OR_DEPARTMENT_OTHER): Payer: Self-pay

## 2021-08-24 ENCOUNTER — Other Ambulatory Visit (HOSPITAL_BASED_OUTPATIENT_CLINIC_OR_DEPARTMENT_OTHER): Payer: Self-pay

## 2021-08-24 ENCOUNTER — Telehealth: Payer: Self-pay | Admitting: Nurse Practitioner

## 2021-08-24 ENCOUNTER — Emergency Department: Payer: BC Managed Care – PPO

## 2021-08-24 ENCOUNTER — Emergency Department (HOSPITAL_BASED_OUTPATIENT_CLINIC_OR_DEPARTMENT_OTHER)
Admission: EM | Admit: 2021-08-24 | Discharge: 2021-08-24 | Disposition: A | Discharge status: Home / Self Care | Payer: BC Managed Care – PPO | Attending: Emergency Medicine | Admitting: Emergency Medicine

## 2021-08-24 ENCOUNTER — Ambulatory Visit: Payer: BC Managed Care – PPO | Attending: Family Medicine

## 2021-08-24 DIAGNOSIS — N83202 Unspecified ovarian cyst, left side: Secondary | ICD-10-CM | POA: Diagnosis not present

## 2021-08-24 DIAGNOSIS — E278 Other specified disorders of adrenal gland: Secondary | ICD-10-CM | POA: Diagnosis not present

## 2021-08-24 DIAGNOSIS — R102 Pelvic and perineal pain: Secondary | ICD-10-CM | POA: Diagnosis not present

## 2021-08-24 DIAGNOSIS — D259 Leiomyoma of uterus, unspecified: Secondary | ICD-10-CM | POA: Diagnosis not present

## 2021-08-24 DIAGNOSIS — R112 Nausea with vomiting, unspecified: Secondary | ICD-10-CM | POA: Diagnosis not present

## 2021-08-24 DIAGNOSIS — R1032 Left lower quadrant pain: Secondary | ICD-10-CM | POA: Insufficient documentation

## 2021-08-24 DIAGNOSIS — N281 Cyst of kidney, acquired: Secondary | ICD-10-CM | POA: Diagnosis not present

## 2021-08-24 LAB — COMPREHENSIVE METABOLIC PANEL
AG Ratio: 1.3 (calc) (ref 1.0–2.5)
ALT: 14 U/L (ref 6–29)
ALT: 16 U/L (ref 0–44)
AST: 22 U/L (ref 10–35)
AST: 19 U/L (ref 15–41)
Albumin: 4.4 g/dL (ref 3.6–5.1)
Albumin: 4.4 g/dL (ref 3.5–5.0)
Alkaline Phosphatase: 64 U/L (ref 38–126)
Alkaline phosphatase (APISO): 73 U/L (ref 31–125)
Anion gap: 12 (ref 5–15)
BUN: 12 mg/dL (ref 7–25)
BUN: 13 mg/dL (ref 6–20)
CO2: 29 mmol/L (ref 20–32)
CO2: 28 mmol/L (ref 22–32)
Calcium: 10 mg/dL (ref 8.6–10.2)
Calcium: 10 mg/dL (ref 8.9–10.3)
Chloride: 101 mmol/L (ref 98–110)
Chloride: 99 mmol/L (ref 98–111)
Creat: 0.96 mg/dL (ref 0.50–0.99)
Creatinine, Ser: 0.92 mg/dL (ref 0.44–1.00)
GFR, Estimated: >60 mL/min (ref >60)
Globulin: 3.4 g/dL (calc) (ref 1.9–3.7)
Glucose, Bld: 91 mg/dL (ref 65–99)
Glucose, Bld: 109 mg/dL — ABNORMAL HIGH (ref 70–99)
Potassium: 3.4 mmol/L — ABNORMAL LOW (ref 3.5–5.3)
Potassium: 3.2 mmol/L — ABNORMAL LOW (ref 3.5–5.1)
Sodium: 139 mmol/L (ref 135–146)
Sodium: 139 mmol/L (ref 135–145)
Total Bilirubin: 0.5 mg/dL (ref 0.2–1.2)
Total Bilirubin: 0.6 mg/dL (ref 0.3–1.2)
Total Protein: 7.8 g/dL (ref 6.1–8.1)
Total Protein: 8.3 g/dL — ABNORMAL HIGH (ref 6.5–8.1)

## 2021-08-24 LAB — CBC WITH DIFFERENTIAL/PLATELET
Abs Immature Granulocytes: 0.04 10*3/uL (ref 0.00–0.07)
Absolute Monocytes: 628 cells/uL (ref 200–950)
Basophils Absolute: 36 cells/uL (ref 0–200)
Basophils Absolute: 0 10*3/uL (ref 0.0–0.1)
Basophils Relative: 0.4 %
Basophils Relative: 0 %
Eosinophils Absolute: 27 cells/uL (ref 15–500)
Eosinophils Absolute: 0 10*3/uL (ref 0.0–0.5)
Eosinophils Relative: 0.3 %
Eosinophils Relative: 0 %
HCT: 42.5 % (ref 35.0–45.0)
HCT: 41.4 % (ref 36.0–46.0)
Hemoglobin: 13.8 g/dL (ref 11.7–15.5)
Hemoglobin: 13.6 g/dL (ref 12.0–15.0)
Immature Granulocytes: 0 %
Lymphocytes Relative: 14 %
Lymphs Abs: 1665 cells/uL (ref 850–3900)
Lymphs Abs: 1.4 10*3/uL (ref 0.7–4.0)
MCH: 28.8 pg (ref 27.0–33.0)
MCH: 29.2 pg (ref 26.0–34.0)
MCHC: 32.5 g/dL (ref 32.0–36.0)
MCHC: 32.9 g/dL (ref 30.0–36.0)
MCV: 88.5 fL (ref 80.0–100.0)
MCV: 88.8 fL (ref 80.0–100.0)
MPV: 10.2 fL (ref 7.5–12.5)
Monocytes Absolute: 0.6 10*3/uL (ref 0.1–1.0)
Monocytes Relative: 6.9 %
Monocytes Relative: 6 %
Neutro Abs: 6743 cells/uL (ref 1500–7800)
Neutro Abs: 7.5 10*3/uL (ref 1.7–7.7)
Neutrophils Relative %: 74.1 %
Neutrophils Relative %: 80 %
Platelets: 348 10*3/uL (ref 140–400)
Platelets: 297 10*3/uL (ref 150–400)
RBC: 4.8 10*6/uL (ref 3.80–5.10)
RBC: 4.66 MIL/uL (ref 3.87–5.11)
RDW: 13.1 % (ref 11.0–15.0)
RDW: 13.7 % (ref 11.5–15.5)
Total Lymphocyte: 18.3 %
WBC: 9.1 10*3/uL (ref 3.8–10.8)
WBC: 9.6 10*3/uL (ref 4.0–10.5)
nRBC: 0 % (ref 0.0–0.2)

## 2021-08-24 LAB — URINALYSIS, ROUTINE W REFLEX MICROSCOPIC
Bilirubin Urine: NEGATIVE
Glucose, UA: NEGATIVE mg/dL
Ketones, ur: 15 mg/dL — AB
Nitrite: NEGATIVE
Protein, ur: 100 mg/dL — AB
Specific Gravity, Urine: 1.032 — ABNORMAL HIGH (ref 1.005–1.030)
pH: 6 (ref 5.0–8.0)

## 2021-08-24 LAB — PREGNANCY, URINE: Preg Test, Ur: NEGATIVE

## 2021-08-24 LAB — LIPASE, BLOOD: Lipase: 14 U/L (ref 11–51)

## 2021-08-24 MED ORDER — KETOROLAC TROMETHAMINE 15 MG/ML IJ SOLN
15.0000 mg | Freq: Once | INTRAMUSCULAR | Status: AC
  Administered 2021-08-24: 15 mg via INTRAVENOUS
  Filled 2021-08-24: qty 1

## 2021-08-24 MED ORDER — OXYCODONE-ACETAMINOPHEN 5-325 MG PO TABS
1.0000 | ORAL_TABLET | Freq: Four times a day (QID) | ORAL | 0 refills | Status: AC | PRN
  Filled 2021-08-24: qty 10, 3d supply, fill #0

## 2021-08-24 MED ORDER — IOHEXOL 300 MG/ML  SOLN
100.0000 mL | Freq: Once | INTRAMUSCULAR | Status: AC | PRN
  Administered 2021-08-24: 100 mL via INTRAVENOUS

## 2021-08-24 MED ORDER — FENTANYL CITRATE PF 50 MCG/ML IJ SOSY
50.0000 ug | PREFILLED_SYRINGE | Freq: Once | INTRAMUSCULAR | Status: AC
  Administered 2021-08-24: 50 ug via INTRAVENOUS
  Filled 2021-08-24: qty 1

## 2021-08-24 MED ORDER — ONDANSETRON HCL 4 MG/2ML IJ SOLN
4.0000 mg | Freq: Once | INTRAMUSCULAR | Status: AC
  Administered 2021-08-24: 4 mg via INTRAVENOUS
  Filled 2021-08-24: qty 2

## 2021-08-24 NOTE — ED Provider Notes (Signed)
[name] EMERGENCY DEPT Provider Note   CSN: [id number] Arrival date & time: 08/24/21  0751     History  Chief Complaint  Patient presents with   Abdominal Pain    Ann Morgan is a 51 y.o. female.  Presented to the emergency department due to concern for abdominal pain.  Patient reports that for about 2 days she has been having relatively constant left-sided abdominal pain.  Primarily left lower.  No vaginal bleeding or vaginal discharge.  No dysuria or hematuria.  Went to PCP yesterday who recommended she have an ultrasound completed.  Yesterday had some nausea and a couple episodes of vomiting.  No vomiting today.  Pain is moderate, sharp, aching.  HPI     Home Medications Prior to Admission medications   Medication Sig Start Date End Date Taking? Authorizing Provider  medroxyPROGESTERone (PROVERA) 10 MG tablet Take 1 tablet (10 mg total) by mouth daily. Take days 1-10 of each month 04/23/19   Will Bonnet, MD  Prenatal Vit-Fe Fumarate-FA (PRENATAL MULTIVITAMIN) TABS Take 1 tablet by mouth daily.     [provider]  triamterene-hydrochlorothiazide (DYAZIDE) 37.5-25 MG capsule Take 1 each (1 capsule total) by mouth every morning. 12/23/20   Bo Merino, FNP      Allergies    Patient has no known allergies.    Review of Systems   Review of Systems  Constitutional:  Negative for chills and fever.  HENT:  Negative for ear pain and sore throat.   Eyes:  Negative for pain and visual disturbance.  Respiratory:  Negative for cough and shortness of breath.   Cardiovascular:  Negative for chest pain and palpitations.  Gastrointestinal:  Positive for abdominal pain, nausea and vomiting.  Genitourinary:  Negative for dysuria and hematuria.  Musculoskeletal:  Negative for arthralgias and back pain.  Skin:  Negative for color change and rash.  Neurological:  Negative for seizures and syncope.  All other systems reviewed and are  negative.   Physical Exam Updated Vital Signs BP (!) 158/96 (BP Location: Left Arm)   Pulse 63   Temp 99.6 F (37.6 C)   Resp 19   Ht '5\' 9"'$  (1.753 m)   Wt 125.6 kg   SpO2 98%   BMI 40.91 kg/m  Physical Exam Vitals and nursing note reviewed.  Constitutional:      General: She is not in acute distress.    Appearance: She is well-developed.  HENT:     Head: Normocephalic and atraumatic.  Eyes:     Conjunctiva/sclera: Conjunctivae normal.  Cardiovascular:     Rate and Rhythm: Normal rate and regular rhythm.     Heart sounds: No murmur heard. Pulmonary:     Effort: Pulmonary effort is normal. No respiratory distress.     Breath sounds: Normal breath sounds.  Abdominal:     Palpations: Abdomen is soft.     Tenderness: There is abdominal tenderness in the left lower quadrant. There is no guarding or rebound. Negative signs include Murphy's sign, Rovsing's sign and McBurney's sign.  Musculoskeletal:        General: No swelling.     Cervical back: Neck supple.  Skin:    General: Skin is warm and dry.     Capillary Refill: Capillary refill takes less than 2 seconds.  Neurological:     Mental Status: She is alert.  Psychiatric:        Mood and Affect: Mood normal.     ED Results /  Procedures / Treatments   Labs (all labs ordered are listed, but only abnormal results are displayed) Labs Reviewed  COMPREHENSIVE METABOLIC PANEL - Abnormal; Notable for the following components:      Result Value   Potassium 3.2 (*)    Glucose, Bld 109 (*)    Total Protein 8.3 (*)    All other components within normal limits  URINALYSIS, ROUTINE W REFLEX MICROSCOPIC - Abnormal; Notable for the following components:   Specific Gravity, Urine 1.032 (*)    Hgb urine dipstick SMALL (*)    Ketones, ur 15 (*)    Protein, ur 100 (*)    Leukocytes,Ua TRACE (*)    Bacteria, UA FEW (*)    All other components within normal limits  CBC WITH DIFFERENTIAL/PLATELET  LIPASE, BLOOD  PREGNANCY, URINE     EKG None  Radiology CT ABDOMEN PELVIS W CONTRAST  Result Date: 08/24/2021 CLINICAL DATA:  Abdominal pain, nausea and vomiting for 2 days, concern for diverticulitis EXAM: CT ABDOMEN AND PELVIS WITH CONTRAST TECHNIQUE: Multidetector CT imaging of the abdomen and pelvis was performed using the standard protocol following bolus administration of intravenous contrast. RADIATION DOSE REDUCTION: This exam was performed according to the departmental dose-optimization program which includes automated exposure control, adjustment of the mA and/or kV according to patient size and/or use of iterative reconstruction technique. CONTRAST:  166m OMNIPAQUE IOHEXOL 300 MG/ML  SOLN COMPARISON:  08/24/2021 pelvic ultrasound FINDINGS: Lower chest: No acute abnormality. Hepatobiliary: No focal liver abnormality is seen. No gallstones, gallbladder wall thickening, or biliary dilatation. Pancreas: Unremarkable. No pancreatic ductal dilatation or surrounding inflammatory changes. Spleen: Normal in size without focal abnormality. Adrenals/Urinary Tract: Both kidneys demonstrate a few scattered tiny subcentimeter cortical renal cysts. No further imaging follow-up recommended. No hydronephrosis. No acute obstructive uropathy, perinephric inflammation or associated hydroureter. Ureters are symmetric and decompressed. Bladder collapsed. Two small left adrenal nodules 1 measuring 17 mm and a second measuring 14 mm. No available comparison studies. Lesions are indeterminate by contrast CT but certainly could be adenomas. Recommend follow-up nonemergent imaging with MRI utilizing an adrenal protocol. Normal right adrenal gland Stomach/Bowel: Stomach is within normal limits. Appendix appears normal. No evidence of bowel wall thickening, distention, or inflammatory changes. Vascular/Lymphatic: No significant vascular findings are present. No enlarged abdominal or pelvic lymph nodes. Reproductive: 8.6 cm simple appearing right adnexal  likely ovarian cyst. Nonspecific trace pelvic free fluid. Dominant right uterine fibroid measures 6.3 cm by CT. No pelvic hemorrhage, hematoma, fluid collection, or abscess. Other: No abdominal wall hernia or abnormality. No abdominopelvic ascites. Musculoskeletal: Acute osseous finding. IMPRESSION: No acute intra-abdominal or pelvic finding by CT. Two indeterminate small left adrenal nodules as above. Recommend follow-up nonemergent MRI. 8.6 cm simple appearing likely ovarian adnexal cyst. Ultrasound has already been performed earlier today. Please refer to the ultrasound report for recommendation. Fibroid uterus. Trace pelvic free fluid likely physiologic. Electronically Signed   By: MJerilynn Mages  Shick M.D.   On: 08/24/2021 10:49   [place] COMPLETE W TRANSVAGINAL AND TORSION R/O  Result Date: 08/24/2021 CLINICAL DATA:  Left-sided pelvic pain for the past 2 days with nausea and vomiting. History of PCOS. EXAM: TRANSABDOMINAL AND TRANSVAGINAL ULTRASOUND OF PELVIS DOPPLER ULTRASOUND OF OVARIES TECHNIQUE: Both transabdominal and transvaginal ultrasound examinations of the pelvis were performed. Transabdominal technique was performed for global imaging of the pelvis including uterus, ovaries, adnexal regions, and pelvic cul-de-sac. It was necessary to proceed with endovaginal exam following the transabdominal exam to visualize the ovaries. Color  and duplex Doppler ultrasound was utilized to evaluate blood flow to the ovaries. COMPARISON:  None Available. FINDINGS: Uterus Measurements: 3.1 x 5.4 x 6.2 cm = volume: 143 mL. Large 6.7 x 5.8 x 6.5 cm fibroid in the right body. Endometrium Thickness: 13 mm.  No focal abnormality visualized. Right ovary Measurements: 3.6 x 3.1 x 3.1 cm = volume: 18.3 mL. Normal appearance/no adnexal mass. Left ovary Measurements: 4.7 x 2.6 x 2.8 cm = volume: 18.0 mL. 8.6 x 6.1 x 7.9 cm simple appearing left adnexal cyst. Pulsed Doppler evaluation of both ovaries demonstrates normal low-resistance  arterial and venous waveforms in the left ovary. Normal low resistance venous waveform in the right ovary with difficulty obtaining a normal arterial waveform, although doing so is technically difficult given the ovary location. Other findings Trace free fluid, likely physiologic. IMPRESSION: 1. 8.6 cm simple appearing left adnexal cyst. Recommend follow-up US in 3-6 months. Note: This recommendation does not apply to premenarchal patients or to those with increased risk (genetic, family history, elevated tumor markers or other high-risk factors) of ovarian cancer. Reference: Radiology 2019 Nov; 293(2):359-371. 2. Technically difficult arterial doppler evaluation of the right ovary due to location, although favored to be within normal limits. Consider repeat evaluation with short term follow up pelvic ultrasound. 3. 6.7 cm uterine fibroid. Electronically Signed   By: Titus Dubin M.D.   On: 08/24/2021 10:12    Procedures Procedures    Medications Ordered in ED Medications  ondansetron (ZOFRAN) injection 4 mg (4 mg Intravenous Given 08/24/21 0831)  fentaNYL (SUBLIMAZE) injection 50 mcg (50 mcg Intravenous Given 08/24/21 0832)  ketorolac (TORADOL) 15 MG/ML injection 15 mg (15 mg Intravenous Given 08/24/21 0953)  iohexol (OMNIPAQUE) 300 MG/ML solution 100 mL (100 mLs Intravenous Contrast Given 08/24/21 1026)    ED Course/ Medical Decision Making/ A&P Clinical Course as of 08/24/21 1134  Thu Aug 24, 2021  1103 Prentice Docker is obgyn  [RD]    Clinical Course User Index [RD] Lucrezia Starch, MD                           Medical Decision Making Amount and/or Complexity of Data Reviewed Labs: ordered. Radiology: ordered.  Risk Prescription drug management.   51 year old lady presenting to the emergency department due to concern for abdominal pain.  Pain is primarily left lower quadrant, some associated tenderness palpation left lower quadrant.  Additional history was obtained from review  of chart, reviewed PCP note from yesterday.  She was concerned about torsion and had ordered outpatient ultrasound of the pelvis.  Basic labs grossly stable, no leukocytosis, no renal dysfunction or electrolyte derangement.  No UTI.  TVUS showed uterine fibroids, 8 cm left adnexal cyst.  Radiologist recommending follow-up ultrasound.  Also technically difficult arterial evaluation of right ovary.  Patient had no pain on her right side.  I discussed these findings with patient and advised discussing further with her gynecologist and requesting repeat outpatient ultrasound for further monitoring.  CT scan obtained to evaluate for diverticulitis or other acute abdominal process, CT scan was reviewed and interpreted independently by myself.  No acute abdominal pathology identified.  The radiologist commented on 2 indeterminate small left adrenal nodules and recommended follow-up nonemergent MRI.  Discussed CT findings with patient and need for follow-up with PCP and nonemergent MRI.  Symptoms are well controlled at this time.  We will go ahead and discharge patient home, reviewed return precautions and discharged  After the discussed management above, the patient was determined to be safe for discharge.  The patient was in agreement with this plan and all questions regarding their care were answered.  ED return precautions were discussed and the patient will return to the ED with any significant worsening of condition.         Final Clinical Impression(s) / ED Diagnoses Final diagnoses:  LLQ abdominal pain    Rx / DC Orders ED Discharge Orders     None         Lucrezia Starch, MD 08/24/21 1134

## 2021-08-24 NOTE — Telephone Encounter (Signed)
Pt is scheduled with Almyra Free for 08/25/21 for a virtual appt   Copied from [place] [id number]. Topic: General - Other >> Aug 24, 2021  2:24 PM Faith T wrote: Reason for CRM: Pt called in stating she was recently seen at the office yesterday for abdominal pain, but it go so bad she ended up going to the ED in the middle of the night, and they got some [place] and CT done, and found some things, pt was wanting to do a follow up, but no available appts with PCP, pt requested if she could be seen sooner, or someone reach out, please advise. CB: [phone].

## 2021-08-24 NOTE — Discharge Instructions (Signed)
The ultrasound showed a large cyst and uterine fibroids.  Please follow-up with your gynecologist to discuss this further and requested follow-up pelvic ultrasound.  The radiologist also commented that you have an adrenal nodule and they recommended obtaining a nonemergent MRI to further evaluate.  Please discuss with your primary care provider and request MRI be obtained for further evaluation.  Take Tylenol and Motrin for pain control.  For breakthrough pain take the prescribed Percocet as needed.  Please note this can make you drowsy and should not be taken while driving or operating heavy machinery.  Additionally it contains some Tylenol.  If you develop significant increase in your pain, vomiting, fever or other new concerning symptom, return to ER for reassessment.

## 2021-08-24 NOTE — ED Triage Notes (Signed)
Pt c/o abdominal pain, nausea, and vomiting x 2 days.

## 2021-08-25 ENCOUNTER — Encounter: Payer: Self-pay | Admitting: Nurse Practitioner

## 2021-08-25 ENCOUNTER — Other Ambulatory Visit: Payer: Self-pay

## 2021-08-25 ENCOUNTER — Telehealth (INDEPENDENT_AMBULATORY_CARE_PROVIDER_SITE_OTHER): Payer: BC Managed Care – PPO | Admitting: Nurse Practitioner

## 2021-08-25 DIAGNOSIS — E278 Other specified disorders of adrenal gland: Secondary | ICD-10-CM | POA: Diagnosis not present

## 2021-08-25 DIAGNOSIS — R1032 Left lower quadrant pain: Secondary | ICD-10-CM

## 2021-08-25 DIAGNOSIS — N949 Unspecified condition associated with female genital organs and menstrual cycle: Secondary | ICD-10-CM

## 2021-08-25 DIAGNOSIS — E279 Disorder of adrenal gland, unspecified: Secondary | ICD-10-CM

## 2021-08-25 DIAGNOSIS — N83202 Unspecified ovarian cyst, left side: Secondary | ICD-10-CM | POA: Diagnosis not present

## 2021-08-25 NOTE — Progress Notes (Signed)
Name: Ann Morgan   MRN: [id number]    DOB: [birth year]   Date:08/25/2021       Progress Note  Subjective  Chief Complaint  Chief Complaint  Patient presents with   Abdominal Pain    Vomiting for 4 days, seen in ER. GYN appointment today    I connected with  Ann Morgan  on 08/25/21 at 10:00 by a video enabled telemedicine application and verified that I am speaking with the correct person using two identifiers.  I discussed the limitations of evaluation and management by telemedicine and the availability of in person appointments. The patient expressed understanding and agreed to proceed with a virtual visit  Staff also discussed with the patient that there may be a patient responsible charge related to this service. Patient Location: home Provider Location: [facility] Additional Individuals present: alone  HPI  Er follow up/abdominal pain: Patient was seen by Dr. Ky Morgan on 08/23/2021.  She was seen for complaint of left lower quadrant abdominal pain that started the day before.  Described as sometimes dull or sharp in character.  She had been having some nausea and vomiting.  Labs were completed at that time an ultrasound was ordered.  However while patient was waiting to get ultrasound done, pain increased and she was seen in the emergency department yesterday on 08/24/2021.  She had a pelvic ultrasound done which showed: 1. 8.6 cm simple appearing left adnexal cyst. Recommend follow-up US in 3-6 months. Note: This recommendation does not apply to premenarchal patients or to those with increased risk (genetic, family history, elevated tumor markers or other high-risk factors) of ovarian cancer. Reference: Radiology 2019 Nov; 293(2):359-371. 2. Technically difficult arterial doppler evaluation of the right ovary due to location, although favored to be within normal limits. Consider repeat evaluation with short term follow up pelvic ultrasound. 3. 6.7 cm uterine fibroid.  He  also had a CT abdomen pelvis done which showed:  No acute intra-abdominal or pelvic finding by CT.   Two indeterminate small left adrenal nodules as above. Recommend follow-up nonemergent MRI.   8.6 cm simple appearing likely ovarian adnexal cyst. Ultrasound has already been performed earlier today. Please refer to the ultrasound report for recommendation.   Fibroid uterus.   Trace pelvic free fluid likely physiologic.  Patient has an appointment with GYN today at 1:30 pm.  Placed order for MRI per radiologist recommendation.  Patient Active Problem List   Diagnosis Date Noted   Abdominal pain 02/72/5366   Complications affecting other specified body systems, hypertension 09/02/2020   Muscle weakness 09/02/2020   Traumatic complete tear of right rotator cuff 04/10/2019   Reactive airway disease without complication 44/05/4740   Epidermal cyst of neck 02/17/2018   Skin abnormalities 07/15/2017   Endometrial hyperplasia without atypia, simple 05/17/2017   Allergic rhinitis 03/07/2017   Patellofemoral pain syndrome of both knees 02/04/2017   Vasovagal syncope 02/04/2017   Glucose intolerance 01/01/2017   Overactive bladder 01/01/2017   Body mass index (BMI) of 45.0-49.9 in adult [facility]) 09/19/2015   Injury of left wrist 05/09/2015   Screening for STD (sexually transmitted disease) 04/06/2015   Varicose veins of both lower extremities with pain 10/27/2014   Mild intermittent asthma with acute exacerbation 10/19/2013   Breast cyst 12/11/2012   Abnormal mammogram 06/09/2012   Premenopausal menorrhagia 04/28/2012   Polycystic ovaries 03/29/2010   Class 3 severe obesity due to excess calories in adult [facility]) 03/29/2010   Essential hypertension 03/29/2010    Social History  Tobacco Use   Smoking status: Never   Smokeless tobacco: Never  Substance Use Topics   Alcohol use: No     Current Outpatient Medications:    medroxyPROGESTERone (PROVERA) 10 MG tablet, Take 1 tablet  (10 mg total) by mouth daily. Take days 1-10 of each month, Disp: 30 tablet, Rfl: 2   oxyCODONE-acetaminophen (PERCOCET/ROXICET) 5-325 MG tablet, Take 1 tablet by mouth every 6 (six) hours as needed for up to 3 days for severe pain., Disp: 10 tablet, Rfl: 0   Prenatal Vit-Fe Fumarate-FA (PRENATAL MULTIVITAMIN) TABS, Take 1 tablet by mouth daily. , Disp: , Rfl:    triamterene-hydrochlorothiazide (DYAZIDE) 37.5-25 MG capsule, Take 1 each (1 capsule total) by mouth every morning., Disp: 90 capsule, Rfl: 3  No Known Allergies  I personally reviewed active problem list, medication list, allergies, notes from last encounter with the patient/caregiver today.  ROS  Constitutional: Negative for fever or weight change.  Respiratory: Negative for cough and shortness of breath.   Cardiovascular: Negative for chest pain or palpitations.  Gastrointestinal: positive for abdominal pain, no bowel changes.  Musculoskeletal: Negative for gait problem or joint swelling.  Skin: Negative for rash.  Neurological: Negative for dizziness or headache.  No other specific complaints in a complete review of systems (except as listed in HPI above).   Objective  Virtual encounter, vitals not obtained.  There is no height or weight on file to calculate BMI.  Nursing Note and Vital Signs reviewed.  Physical Exam  Awake, alert, oriented x3.  Results for orders placed or performed during the hospital encounter of 08/24/21 (from the past 72 hour(s))  CBC with Differential     Status: None   Collection Time: 08/24/21  8:08 AM  Result Value Ref Range   WBC 9.6 4.0 - 10.5 K/uL   RBC 4.66 3.87 - 5.11 MIL/uL   Hemoglobin 13.6 12.0 - 15.0 g/dL   HCT 41.4 36.0 - 46.0 %   MCV 88.8 80.0 - 100.0 fL   MCH 29.2 26.0 - 34.0 pg   MCHC 32.9 30.0 - 36.0 g/dL   RDW 13.7 11.5 - 15.5 %   Platelets 297 150 - 400 K/uL   nRBC 0.0 0.0 - 0.2 %   Neutrophils Relative % 80 %   Neutro Abs 7.5 1.7 - 7.7 K/uL   Lymphocytes Relative  14 %   Lymphs Abs 1.4 0.7 - 4.0 K/uL   Monocytes Relative 6 %   Monocytes Absolute 0.6 0.1 - 1.0 K/uL   Eosinophils Relative 0 %   Eosinophils Absolute 0.0 0.0 - 0.5 K/uL   Basophils Relative 0 %   Basophils Absolute 0.0 0.0 - 0.1 K/uL   Immature Granulocytes 0 %   Abs Immature Granulocytes 0.04 0.00 - 0.07 K/uL    Comment: Performed at [facility], [place], [place] [address]  Comprehensive metabolic panel     Status: Abnormal   Collection Time: 08/24/21  8:08 AM  Result Value Ref Range   Sodium 139 135 - 145 mmol/L   Potassium 3.2 (L) 3.5 - 5.1 mmol/L   Chloride 99 98 - 111 mmol/L   CO2 28 22 - 32 mmol/L   Glucose, Bld 109 (H) 70 - 99 mg/dL    Comment: Glucose reference range applies only to samples taken after fasting for at least 8 hours.   BUN 13 6 - 20 mg/dL   Creatinine, Ser 0.92 0.44 - 1.00 mg/dL   Calcium 10.0 8.9 -  10.3 mg/dL   Total Protein 8.3 (H) 6.5 - 8.1 g/dL   Albumin 4.4 3.5 - 5.0 g/dL   AST 19 15 - 41 U/L   ALT 16 0 - 44 U/L   Alkaline Phosphatase 64 38 - 126 U/L   Total Bilirubin 0.6 0.3 - 1.2 mg/dL   GFR, Estimated >60 >60 mL/min    Comment: (NOTE) Calculated using the CKD-EPI Creatinine Equation (2021)    Anion gap 12 5 - 15    Comment: Performed at [facility], [place], [place] [address]  Lipase, blood     Status: None   Collection Time: 08/24/21  8:08 AM  Result Value Ref Range   Lipase 14 11 - 51 U/L    Comment: Performed at [facility], [address] [place], [place] [address]  Urinalysis, Routine w reflex microscopic     Status: Abnormal   Collection Time: 08/24/21  8:33 AM  Result Value Ref Range   Color, Urine YELLOW YELLOW   APPearance CLEAR CLEAR   Specific Gravity, Urine 1.032 (H) 1.005 - 1.030   pH 6.0 5.0 - 8.0   Glucose, UA NEGATIVE NEGATIVE mg/dL   Hgb urine dipstick SMALL (A) NEGATIVE   Bilirubin Urine NEGATIVE NEGATIVE   Ketones, ur 15  (A) NEGATIVE mg/dL   Protein, ur 100 (A) NEGATIVE mg/dL   Nitrite NEGATIVE NEGATIVE   Leukocytes,Ua TRACE (A) NEGATIVE   RBC / HPF 0-5 0 - 5 RBC/hpf   WBC, UA 11-20 0 - 5 WBC/hpf   Bacteria, UA FEW (A) NONE SEEN   Squamous Epithelial / LPF 0-5 0 - 5   Mucus PRESENT     Comment: Performed at [facility], [address] [place], [place] [address]  Pregnancy, urine     Status: None   Collection Time: 08/24/21  8:33 AM  Result Value Ref Range   Preg Test, Ur NEGATIVE NEGATIVE    Comment:        THE SENSITIVITY OF THIS METHODOLOGY IS >20 mIU/mL. Performed at [facility], [address] [place], [place] [address]     Assessment & Plan  1. Left lower quadrant abdominal pain Comments: Patient is doing okay right now. She does have an appointment with GYN today at 1:30 pm.   2. Adrenal nodule ([place]) Comments: Found on Ct scan, Radiologist recommended MRI with adrenal protocol.  Order placed - MR [place]; Future  3. Adnexal cyst Comments: Recommended to have repeat pelvic ultrasound, patient is seeing GYN today.      -Red flags and when to present for emergency care or RTC including fever >101.34F, chest pain, shortness of breath, new/worsening/un-resolving symptoms,  reviewed with patient at time of visit. Follow up and care instructions discussed and provided in AVS. - I discussed the assessment and treatment plan with the patient. The patient was provided an opportunity to ask questions and all were answered. The patient agreed with the plan and demonstrated an understanding of the instructions.  I provided 15 minutes of non-face-to-face time during this encounter.  Bo Merino, FNP

## 2021-08-28 DIAGNOSIS — R1032 Left lower quadrant pain: Secondary | ICD-10-CM | POA: Diagnosis not present

## 2021-08-28 DIAGNOSIS — N83202 Unspecified ovarian cyst, left side: Secondary | ICD-10-CM | POA: Diagnosis not present

## 2021-08-28 NOTE — H&P (Signed)
GYNECOLOGIC HISTORY AND PHYSICAL  Chief Complaint  Patient presents with   Follow-up  LLQ abdominal pain  History of Present Illness: 51 y.o. female who presents in close follow up from a visit with me last Friday (3 days ago) for left lower quadrant pelvic pain and a large left ovarian simple-appearing cyst. At that appointment her pain had improved quite a bit, but was still present.   Compared to last week, her pain has been more constant. She goes from a dull then to sharp pain over the weekend.  She does have some discomfort with voiding.  She stopped taking oxycodone after a dose last night. She hasn't taken anything today, including tylenol.   She has increased her dietary intake.  She no longer has nausea, but her nausea has improved.  Certain positions help.    Past Medical History:  Diagnosis Date   Abnormal Pap smear of cervix    Asthma    Endometrial hyperplasia    Hypertension 2004   Irregular menses, unspecified    Mild intermittent asthma with (acute) exacerbation 10/19/2013   Obesity     Past Surgical History:  Procedure Laterality Date   knee surgery Left 03/2009   ASPIRATION CYST BREAST Left 2015   shoulder arthroscopy with subacromial decompression, rotator cuff repair and bicep tendon repair Right 12/25/2018   COLONOSCOPY  07/25/2020   Hyperplastic polyp/Repeat 80yr/JWB    Gynecologic History: Patient's last menstrual period was 05/31/2021 (approximate).   Family History  Problem Relation Age of Onset   High blood pressure (Hypertension) Mother    High blood pressure (Hypertension) Brother    Diabetes Brother    Breast cancer Maternal Grandmother    Colon cancer Maternal Grandfather    Colon cancer Paternal Grandfather     Social History   Socioeconomic History   Marital status: Married  Tobacco Use   Smoking status: Never   Smokeless tobacco: Never  Vaping Use   Vaping Use: Never used  Substance and Sexual Activity   Alcohol use: Never    Drug use: Never   Sexual activity: Yes    Partners: Male    Birth control/protection: None    Comment: husband    No Known Allergies  Prior to Admission medications   Medication Sig Taking? Last Dose  bisacodyL (DULCOLAX) 5 mg EC tablet Take two tablets morning and two tablets afternoon day prior to Miralax prep. Yes Taking  naproxen sodium (ALEVE) 220 MG tablet Take by mouth as needed Yes Taking  polyethylene glycol (MIRALAX) powder One bottle for colonoscopy prep. Use as directed. Yes Taking  prenatal vitamin with iron-folic acid (PRENATAL TABLETS) tablet Take 1 tablet by mouth once daily Yes Taking  triamterene-hydrochlorothiazide (DYAZIDE) 37.5-25 mg capsule Take 1 capsule by mouth every morning Yes Taking  medroxyPROGESTERone (PROVERA) 10 MG tablet Take 1 tablet (10 mg total) by mouth once daily for 10 days Repeat every 3-4 months      Review of Systems  Constitutional: Negative.   HENT: Negative.    Eyes: Negative.   Respiratory: Negative.    Cardiovascular: Negative.   Gastrointestinal:  Positive for abdominal pain (see HPI). Negative for nausea and vomiting.  Genitourinary: Negative.   Musculoskeletal: Negative.   Skin: Negative.   Neurological: Negative.   Endo/Heme/Allergies: Negative.   Psychiatric/Behavioral: Negative.      Physical Exam BP 110/80   Pulse 70   Ht 175.3 cm ('5\' 9"'$ )   Wt (!) 122.5 kg (270 lb)   LMP 05/31/2021 (  Approximate)   BMI 39.87 kg/m  Patient's last menstrual period was 05/31/2021 (approximate). Physical Exam Constitutional:      Appearance: Normal appearance.  HENT:     Head: Normocephalic and atraumatic.  Eyes:     General: No scleral icterus.    Conjunctiva/sclera: Conjunctivae normal.  Cardiovascular:     Rate and Rhythm: Normal rate and regular rhythm.     Heart sounds: No murmur heard.   No friction rub. No gallop.  Pulmonary:     Effort: Pulmonary effort is normal. No respiratory distress.     Breath sounds: No wheezing,  rhonchi or rales.  Musculoskeletal:        General: No swelling. Normal range of motion.  Neurological:     General: No focal deficit present.     Mental Status: She is alert and oriented to person, place, and time.     Cranial Nerves: No cranial nerve deficit.  Skin:    General: Skin is warm and dry.     Findings: No lesion.  Psychiatric:        Mood and Affect: Mood normal.        Behavior: Behavior normal.        Judgment: Judgment normal.   Female chaperone present for pelvic and breast  portions of the physical exam  Assessment: 51 y.o. No obstetric history on file. female here for  1. Left ovarian cyst   2. Abdominal pain, LLQ (left lower quadrant)     Plan: Problem List Items Addressed This Visit   None Visit Diagnoses     Left ovarian cyst    -  Primary   Abdominal pain, LLQ (left lower quadrant)          She continues to have similar pain. As discussed before, I have concern for ovarian torsion at this point. It appears to be intermittent given the ultrasound findings and her symptoms.  However, I am concerned that she could have an acute episode. Today she is agreeable to having the left ovarian cyst removed and inspecting for torsion and reducing any torsion found. We discussed that given the length of time from the initial onset of her symptoms until she presented three days ago, that she could have an ischemic ovary that might not be salvageable.  So, a removal of the ovary might be necessary.  She was counseled to go to the ER if her symptoms worsen and she should have a low threshold to do so.  She voiced understanding. We discussed the surgical process and the risks and benefits of the surgery.  Will try to get her into the OR as soon as is feasible.  If I can't get her in soon, I will try to do so with one of my partners.    Attestation Statement:   I personally performed the service. (TP)  [place], MD  [place] 08/28/2021 11:55 AM

## 2021-08-29 ENCOUNTER — Other Ambulatory Visit: Payer: Self-pay | Admitting: Obstetrics and Gynecology

## 2021-08-30 ENCOUNTER — Encounter
Admission: RE | Admit: 2021-08-30 | Discharge: 2021-08-30 | Disposition: A | Discharge status: Home / Self Care | Payer: BC Managed Care – PPO | Source: Ambulatory Visit | Attending: Obstetrics and Gynecology | Admitting: Obstetrics and Gynecology

## 2021-08-30 ENCOUNTER — Telehealth: Payer: Self-pay | Admitting: Urgent Care

## 2021-08-30 VITALS — Ht 69.0 in | Wt 270.0 lb

## 2021-08-30 DIAGNOSIS — Z01812 Encounter for preprocedural laboratory examination: Secondary | ICD-10-CM

## 2021-08-30 DIAGNOSIS — Z6839 Body mass index (BMI) 39.0-39.9, adult: Secondary | ICD-10-CM | POA: Diagnosis not present

## 2021-08-30 DIAGNOSIS — N7092 Oophoritis, unspecified: Secondary | ICD-10-CM | POA: Diagnosis not present

## 2021-08-30 DIAGNOSIS — N83202 Unspecified ovarian cyst, left side: Secondary | ICD-10-CM

## 2021-08-30 DIAGNOSIS — N3281 Overactive bladder: Secondary | ICD-10-CM | POA: Insufficient documentation

## 2021-08-30 DIAGNOSIS — R1032 Left lower quadrant pain: Secondary | ICD-10-CM | POA: Insufficient documentation

## 2021-08-30 DIAGNOSIS — I10 Essential (primary) hypertension: Secondary | ICD-10-CM | POA: Insufficient documentation

## 2021-08-30 DIAGNOSIS — N8353 Torsion of ovary, ovarian pedicle and fallopian tube: Secondary | ICD-10-CM | POA: Diagnosis not present

## 2021-08-30 DIAGNOSIS — T502X5A Adverse effect of carbonic-anhydrase inhibitors, benzothiadiazides and other diuretics, initial encounter: Secondary | ICD-10-CM

## 2021-08-30 DIAGNOSIS — Z01818 Encounter for other preprocedural examination: Secondary | ICD-10-CM | POA: Insufficient documentation

## 2021-08-30 DIAGNOSIS — E876 Hypokalemia: Secondary | ICD-10-CM

## 2021-08-30 DIAGNOSIS — E669 Obesity, unspecified: Secondary | ICD-10-CM | POA: Diagnosis not present

## 2021-08-30 LAB — TYPE AND SCREEN
ABO/RH(D): O POS
Antibody Screen: NEGATIVE

## 2021-08-30 LAB — POTASSIUM: Potassium: 2.9 mmol/L — ABNORMAL LOW (ref 3.5–5.1)

## 2021-08-30 MED ORDER — POTASSIUM CHLORIDE CRYS ER 20 MEQ PO TBCR: EXTENDED_RELEASE_TABLET | ORAL | 0 refills | Status: DC

## 2021-08-30 NOTE — Progress Notes (Signed)
  [facility] Perioperative Services: Pre-Admission/Anesthesia Testing  Abnormal Lab Notification   Date: 08/30/21  Name: Ann Morgan MRN:   [id number]  Re: Abnormal labs noted during PAT appointment   Notified:  Provider Name Provider Role Notification Mode  Prentice Docker, MD OB/GYN (Surgeon) Routed and/or faxed via [place] Aline, [facility] Primary Care Provider Routed and/or faxed via [place] and Notes:  ABNORMAL LAB VALUE(S): Lab Results  Component Value Date   K 2.9 (L) 08/30/2021   Ann Morgan is scheduled for a ROBOTIC ASSISTED LAPAROSCOPIC OVARIAN CYSTECTOMY; POSSIBLE LEFT SALPINGO OOPHORECTOMY tomorrow. In review of her medication reconciliation, it is noted that the patient is taking prescribed diuretic medications (Maxide). Please note, in efforts to promote a safe and effective anesthetic course, per current guidelines/standards set by the [facility] anesthesia team, the minimal acceptable K+ level for the patient to proceed with general anesthesia is 3.0 mmol/L. With that being said, in efforts to prevent case from being cancelled, hypokalemia will be treated by PAT provider.   Impression and Plan:  Ann Morgan with noted hypokalemia on preoperative labs.  She was low a week ago as well. Sending in prescription for oral supplementation as follows:  Meds ordered this encounter  Medications   potassium chloride SA (KLOR-CON M) 20 MEQ tablet    Sig: Take 3 tablets (60 mEq) today, then 1 tablet (20 mEq) in the morning with a sips of water.    Dispense:  4 tablet    Refill:  0   Patient with diuretic induced hypokalemia. Only sending in dose for today and in the morning to stabilize levels for surgery. Order placed for K+ to be rechecked tomorrow morning prior to her procedure to ensure correction of noted electrolyte derangement.  Patient encouraged to follow-up with PCP postoperatively to have labs rechecked  and discuss potential change in diuretic therapy to another medication. Could also consider low dose daily K+ supplement, however this carries the risk of associated hyperkalemia associated with the triamterene. Will send a copy of this note to PCP for review and follow up as well.   Encounter Diagnoses  Name Primary?   Pre-operative laboratory examination Yes   Diuretic-induced hypokalemia    Honor Loh, MSN, APRN, FNP-C, CEN [facility]  Peri-operative Services Nurse Practitioner Phone: [phone] Fax: [phone] 08/30/21 2:43 PM

## 2021-08-30 NOTE — Patient Instructions (Signed)
Your procedure is scheduled on: Thursday August 31, 2021. Report to Day Surgery inside [facility] 2nd floor, stop by admissions desk before getting on elevator. To find out your arrival time please call [phone] between 1PM - 3PM on Wednesday August 30, 2021.  Remember: Instructions that are not followed completely may result in serious medical risk,  up to and including death, or upon the discretion of your surgeon and anesthesiologist your  surgery may need to be rescheduled.     _X__ 1. Do not eat food after midnight the night before your procedure.                 No chewing gum or hard candies. You may drink clear liquids up to 2 hours                 before you are scheduled to arrive for your surgery- DO not drink clear                 liquids within 2 hours of the start of your surgery.                 Clear Liquids include:  water, apple juice without pulp, clear Gatorade, G2 or                  Gatorade Zero (avoid Red/Purple/Blue), Black Coffee or Tea (Do not add                 anything to coffee or tea).  __X__2.   Complete the "Ensure Clear Pre-surgery Clear Carbohydrate Drink" provided to you, 2 hours before arrival. **If you are diabetic you will be provided with an alternative drink, Gatorade Zero or G2.  __X__3.  On the morning of surgery brush your teeth with toothpaste and water, you                may rinse your mouth with mouthwash if you wish.  Do not swallow any toothpaste or mouthwash.     _X__ 4.  No Alcohol for 24 hours before or after surgery.   _X__ 5.  Do Not Smoke or use e-cigarettes For 24 Hours Prior to Your Surgery.                 Do not use any chewable tobacco products for at least 6 hours prior to                 Surgery.  _X__  6.  Do not use any recreational drugs (marijuana, cocaine, heroin, ecstasy, MDMA or other)                For at least one week prior to your surgery.  Combination of these drugs with anesthesia                 May have life threatening results.  ____  7.  Bring all medications with you on the day of surgery if instructed.   __X__8.  Notify your doctor if there is any change in your medical condition      (cold, fever, infections).     Do not wear jewelry, make-up, hairpins, clips or nail polish. Do not wear lotions, powders, or perfumes. You may wear deodorant. Do not shave 48 hours prior to surgery. Men may shave face and neck. Do not bring valuables to the hospital.    [facility] is not responsible for any belongings or valuables.  Contacts, dentures  or bridgework may not be worn into surgery. Leave your suitcase in the car. After surgery it may be brought to your room. For patients admitted to the hospital, discharge time is determined by your treatment team.   Patients discharged the day of surgery will not be allowed to drive home.   Make arrangements for someone to be with you for the first 24 hours of your Same Day Discharge.   __X__ Take these medicines the morning of surgery with A SIP OF WATER:    1. None   2.   3.   4.  5.  6.  ____ Fleet Enema (as directed)   __X__ Use CHG Soap (or wipes) as directed  ____ Use Benzoyl Peroxide Gel as instructed  ____ Use inhalers on the day of surgery  ____ Stop metformin 2 days prior to surgery    ____ Take 1/2 of usual insulin dose the night before surgery. No insulin the morning          of surgery.   ____ Call your PCP, cardiologist, or Pulmonologist if taking Coumadin/Plavix/aspirin and ask when to stop before your surgery.   __X__ One Week prior to surgery- Stop Anti-inflammatories such as Ibuprofen, Aleve, Advil, Motrin, meloxicam (MOBIC), diclofenac, etodolac, ketorolac, Toradol, Daypro, piroxicam, Goody's or BC powders. OK TO USE TYLENOL IF NEEDED   __X__ Do not start any vitamins and or herbal supplements until after surgery.    ____ Bring C-Pap to the hospital.    If you have any questions regarding  your pre-procedure instructions,  Please call Pre-admit Testing at [phone]

## 2021-08-31 ENCOUNTER — Other Ambulatory Visit: Payer: Self-pay

## 2021-08-31 ENCOUNTER — Ambulatory Visit: Payer: BC Managed Care – PPO | Admitting: Urgent Care

## 2021-08-31 ENCOUNTER — Encounter: Payer: Self-pay | Admitting: Obstetrics and Gynecology

## 2021-08-31 ENCOUNTER — Ambulatory Visit
Admission: RE | Admit: 2021-08-31 | Discharge: 2021-08-31 | Disposition: A | Discharge status: Home / Self Care | Payer: BC Managed Care – PPO | Attending: Obstetrics and Gynecology | Admitting: Obstetrics and Gynecology

## 2021-08-31 ENCOUNTER — Encounter
Admission: RE | Disposition: A | Discharge status: Home / Self Care | Payer: Self-pay | Source: Home / Self Care | Attending: Obstetrics and Gynecology

## 2021-08-31 DIAGNOSIS — E876 Hypokalemia: Secondary | ICD-10-CM

## 2021-08-31 DIAGNOSIS — N83292 Other ovarian cyst, left side: Secondary | ICD-10-CM | POA: Diagnosis not present

## 2021-08-31 DIAGNOSIS — I10 Essential (primary) hypertension: Secondary | ICD-10-CM | POA: Insufficient documentation

## 2021-08-31 DIAGNOSIS — E669 Obesity, unspecified: Secondary | ICD-10-CM | POA: Insufficient documentation

## 2021-08-31 DIAGNOSIS — N83202 Unspecified ovarian cyst, left side: Secondary | ICD-10-CM | POA: Diagnosis not present

## 2021-08-31 DIAGNOSIS — Z6839 Body mass index (BMI) 39.0-39.9, adult: Secondary | ICD-10-CM | POA: Insufficient documentation

## 2021-08-31 DIAGNOSIS — N7092 Oophoritis, unspecified: Secondary | ICD-10-CM | POA: Diagnosis not present

## 2021-08-31 DIAGNOSIS — R1032 Left lower quadrant pain: Secondary | ICD-10-CM | POA: Diagnosis present

## 2021-08-31 DIAGNOSIS — Z01812 Encounter for preprocedural laboratory examination: Secondary | ICD-10-CM

## 2021-08-31 DIAGNOSIS — N8353 Torsion of ovary, ovarian pedicle and fallopian tube: Secondary | ICD-10-CM | POA: Clinically undetermined

## 2021-08-31 HISTORY — PX: CYSTOSCOPY: SHX5120

## 2021-08-31 HISTORY — PX: ROBOTIC ASSISTED LAPAROSCOPIC OVARIAN CYSTECTOMY: SHX6081

## 2021-08-31 HISTORY — PX: ROBOTIC ASSISTED LAPAROSCOPIC LYSIS OF ADHESION: SHX6080

## 2021-08-31 LAB — POCT I-STAT, CHEM 8
BUN: 13 mg/dL (ref 6–20)
Calcium, Ion: 1.15 mmol/L (ref 1.15–1.40)
Chloride: 100 mmol/L (ref 98–111)
Creatinine, Ser: 1 mg/dL (ref 0.44–1.00)
Glucose, Bld: 100 mg/dL — ABNORMAL HIGH (ref 70–99)
HCT: 38 % (ref 36.0–46.0)
Hemoglobin: 12.9 g/dL (ref 12.0–15.0)
Potassium: 3.6 mmol/L (ref 3.5–5.1)
Sodium: 139 mmol/L (ref 135–145)
TCO2: 28 mmol/L (ref 22–32)

## 2021-08-31 LAB — POCT PREGNANCY, URINE: Preg Test, Ur: NEGATIVE

## 2021-08-31 LAB — ABO/RH: ABO/RH(D): O POS

## 2021-08-31 SURGERY — EXCISION, CYST, OVARY, ROBOT-ASSISTED, LAPAROSCOPIC
Anesthesia: General | Laterality: Left

## 2021-08-31 MED ORDER — MIDAZOLAM HCL 2 MG/2ML IJ SOLN
INTRAMUSCULAR | Status: DC | PRN
  Administered 2021-08-31: 2 mg via INTRAVENOUS

## 2021-08-31 MED ORDER — FENTANYL CITRATE (PF) 100 MCG/2ML IJ SOLN
25.0000 ug | INTRAMUSCULAR | Status: DC | PRN
  Administered 2021-08-31 (×2): 50 ug via INTRAVENOUS

## 2021-08-31 MED ORDER — ONDANSETRON HCL 4 MG/2ML IJ SOLN
INTRAMUSCULAR | Status: DC | PRN
  Administered 2021-08-31 (×2): 4 mg via INTRAVENOUS

## 2021-08-31 MED ORDER — ORAL CARE MOUTH RINSE: 15.0000 mL | Freq: Once | OROMUCOSAL | Status: AC

## 2021-08-31 MED ORDER — ROCURONIUM BROMIDE 10 MG/ML (PF) SYRINGE
PREFILLED_SYRINGE | INTRAVENOUS | Status: AC
Start: 2021-08-31 — End: ?
  Filled 2021-08-31: qty 30

## 2021-08-31 MED ORDER — OXYCODONE HCL 5 MG PO TABS
ORAL_TABLET | ORAL | Status: AC
  Filled 2021-08-31: qty 1

## 2021-08-31 MED ORDER — IBUPROFEN 600 MG PO TABS: 600.0000 mg | ORAL_TABLET | Freq: Four times a day (QID) | ORAL | 0 refills | Status: DC

## 2021-08-31 MED ORDER — PROPOFOL 10 MG/ML IV BOLUS
INTRAVENOUS | Status: DC | PRN
  Administered 2021-08-31: 200 mg via INTRAVENOUS

## 2021-08-31 MED ORDER — BUPIVACAINE HCL 0.5 % IJ SOLN
INTRAMUSCULAR | Status: DC | PRN
  Administered 2021-08-31: 13 mL

## 2021-08-31 MED ORDER — PROMETHAZINE HCL 25 MG/ML IJ SOLN: 6.2500 mg | INTRAMUSCULAR | Status: DC | PRN

## 2021-08-31 MED ORDER — SUGAMMADEX SODIUM 200 MG/2ML IV SOLN
INTRAVENOUS | Status: DC | PRN
  Administered 2021-08-31: 240 mg via INTRAVENOUS

## 2021-08-31 MED ORDER — ROCURONIUM BROMIDE 100 MG/10ML IV SOLN
INTRAVENOUS | Status: DC | PRN
  Administered 2021-08-31: 30 mg via INTRAVENOUS
  Administered 2021-08-31: 70 mg via INTRAVENOUS

## 2021-08-31 MED ORDER — FENTANYL CITRATE (PF) 100 MCG/2ML IJ SOLN
INTRAMUSCULAR | Status: DC | PRN
  Administered 2021-08-31 (×2): 50 ug via INTRAVENOUS

## 2021-08-31 MED ORDER — OXYCODONE-ACETAMINOPHEN 5-325 MG PO TABS: 1.0000 | ORAL_TABLET | Freq: Four times a day (QID) | ORAL | 0 refills | Status: AC | PRN

## 2021-08-31 MED ORDER — PROPOFOL 10 MG/ML IV BOLUS
INTRAVENOUS | Status: AC
Start: 2021-08-31 — End: ?
  Filled 2021-08-31: qty 20

## 2021-08-31 MED ORDER — FAMOTIDINE 20 MG PO TABS: 20.0000 mg | ORAL_TABLET | Freq: Once | ORAL | Status: AC

## 2021-08-31 MED ORDER — BUPIVACAINE HCL (PF) 0.5 % IJ SOLN
INTRAMUSCULAR | Status: AC
  Filled 2021-08-31: qty 30

## 2021-08-31 MED ORDER — ACETAMINOPHEN 10 MG/ML IV SOLN
1000.0000 mg | Freq: Once | INTRAVENOUS | Status: DC | PRN
Start: 2021-08-31 — End: 2021-08-31

## 2021-08-31 MED ORDER — LACTATED RINGERS IV SOLN: INTRAVENOUS | Status: DC

## 2021-08-31 MED ORDER — GLYCOPYRROLATE 0.2 MG/ML IJ SOLN
INTRAMUSCULAR | Status: DC | PRN
  Administered 2021-08-31: .2 mg via INTRAVENOUS

## 2021-08-31 MED ORDER — LIDOCAINE HCL (CARDIAC) PF 100 MG/5ML IV SOSY
PREFILLED_SYRINGE | INTRAVENOUS | Status: DC | PRN
  Administered 2021-08-31: 100 mg via INTRAVENOUS

## 2021-08-31 MED ORDER — OXYCODONE HCL 5 MG/5ML PO SOLN: 5.0000 mg | Freq: Once | ORAL | Status: AC | PRN

## 2021-08-31 MED ORDER — FENTANYL CITRATE (PF) 100 MCG/2ML IJ SOLN
INTRAMUSCULAR | Status: AC
  Administered 2021-08-31: 50 ug via INTRAVENOUS
  Filled 2021-08-31: qty 2

## 2021-08-31 MED ORDER — MIDAZOLAM HCL 2 MG/2ML IJ SOLN
INTRAMUSCULAR | Status: AC
  Filled 2021-08-31: qty 2

## 2021-08-31 MED ORDER — CHLORHEXIDINE GLUCONATE 0.12 % MT SOLN
OROMUCOSAL | Status: AC
  Administered 2021-08-31: 15 mL via OROMUCOSAL
  Filled 2021-08-31: qty 15

## 2021-08-31 MED ORDER — SODIUM CHLORIDE 0.9 % IR SOLN
Status: DC | PRN
  Administered 2021-08-31: 1000 mL via INTRAVESICAL

## 2021-08-31 MED ORDER — DROPERIDOL 2.5 MG/ML IJ SOLN: 0.6250 mg | Freq: Once | INTRAMUSCULAR | Status: DC | PRN

## 2021-08-31 MED ORDER — FAMOTIDINE 20 MG PO TABS
ORAL_TABLET | ORAL | Status: AC
  Administered 2021-08-31: 20 mg via ORAL
  Filled 2021-08-31: qty 1

## 2021-08-31 MED ORDER — DEXAMETHASONE SODIUM PHOSPHATE 10 MG/ML IJ SOLN
INTRAMUSCULAR | Status: DC | PRN
  Administered 2021-08-31: 10 mg via INTRAVENOUS

## 2021-08-31 MED ORDER — FENTANYL CITRATE (PF) 100 MCG/2ML IJ SOLN
INTRAMUSCULAR | Status: AC
  Filled 2021-08-31: qty 2

## 2021-08-31 MED ORDER — ACETAMINOPHEN 10 MG/ML IV SOLN
INTRAVENOUS | Status: DC | PRN
  Administered 2021-08-31: 1000 mg via INTRAVENOUS

## 2021-08-31 MED ORDER — CHLORHEXIDINE GLUCONATE 0.12 % MT SOLN: 15.0000 mL | Freq: Once | OROMUCOSAL | Status: AC

## 2021-08-31 MED ORDER — DEXMEDETOMIDINE HCL IN NACL 200 MCG/50ML IV SOLN
INTRAVENOUS | Status: DC | PRN
  Administered 2021-08-31: 4 ug via INTRAVENOUS
  Administered 2021-08-31: 8 ug via INTRAVENOUS

## 2021-08-31 MED ORDER — OXYCODONE HCL 5 MG PO TABS
5.0000 mg | ORAL_TABLET | Freq: Once | ORAL | Status: AC | PRN
  Administered 2021-08-31: 5 mg via ORAL

## 2021-08-31 SURGICAL SUPPLY — 80 items
ADH SKN CLS APL DERMABOND .7 (GAUZE/BANDAGES/DRESSINGS) ×2
ANCHOR TIS RET SYS 1550ML (BAG) IMPLANT
ANCHOR TIS RET SYS 235ML (MISCELLANEOUS) ×1 IMPLANT
APL PRP STRL LF DISP 70% ISPRP (MISCELLANEOUS) ×2
BACTOSHIELD CHG 4% 4OZ (MISCELLANEOUS) ×1
BAG DRN RND TRDRP ANRFLXCHMBR (UROLOGICAL SUPPLIES) ×2
BAG SPEC RTRVL C1550 25.4 (BAG)
BAG TISS RTRVL C235 10X14 (MISCELLANEOUS) ×2
BAG URINE DRAIN 2000ML AR STRL (UROLOGICAL SUPPLIES) ×3 IMPLANT
BASIN KIT SINGLE STR (MISCELLANEOUS) ×3 IMPLANT
BLADE SURG SZ11 CARB STEEL (BLADE) ×3 IMPLANT
CATH FOLEY 2WAY  5CC 16FR (CATHETERS) ×3
CATH FOLEY 2WAY 5CC 16FR (CATHETERS) ×2
CATH URTH 16FR FL 2W BLN LF (CATHETERS) ×2 IMPLANT
CHLORAPREP W/TINT 26 (MISCELLANEOUS) ×3 IMPLANT
COVER TIP SHEARS 8 DVNC (MISCELLANEOUS) ×2 IMPLANT
COVER TIP SHEARS 8MM DA VINCI (MISCELLANEOUS) ×3
DEFOGGER SCOPE WARMER CLEARIFY (MISCELLANEOUS) ×3 IMPLANT
DERMABOND ADVANCED (GAUZE/BANDAGES/DRESSINGS) ×1
DERMABOND ADVANCED .7 DNX12 (GAUZE/BANDAGES/DRESSINGS) ×2 IMPLANT
DRAPE 3/4 80X56 (DRAPES) IMPLANT
DRAPE ARM DVNC X/XI (DISPOSABLE) ×6 IMPLANT
DRAPE DA VINCI XI ARM (DISPOSABLE) ×9
DRAPE LEGGINS SURG 28X43 STRL (DRAPES) ×3 IMPLANT
DRAPE UNDER BUTTOCK W/FLU (DRAPES) ×3 IMPLANT
ELECT REM PT RETURN 9FT ADLT (ELECTROSURGICAL) ×3
ELECTRODE REM PT RTRN 9FT ADLT (ELECTROSURGICAL) ×2 IMPLANT
GLOVE BIO SURGEON STRL SZ7 (GLOVE) ×9 IMPLANT
GLOVE SURG UNDER POLY LF SZ7.5 (GLOVE) ×9 IMPLANT
GOWN STRL REUS W/ TWL LRG LVL3 (GOWN DISPOSABLE) ×12 IMPLANT
GOWN STRL REUS W/TWL LRG LVL3 (GOWN DISPOSABLE) ×18
GRASPER SUT TROCAR 14GX15 (MISCELLANEOUS) IMPLANT
IRRIGATION STRYKERFLOW (MISCELLANEOUS) IMPLANT
IRRIGATOR STRYKERFLOW (MISCELLANEOUS)
IV NS 1000ML (IV SOLUTION)
IV NS 1000ML BAXH (IV SOLUTION) IMPLANT
KIT PINK PAD W/HEAD ARE REST (MISCELLANEOUS) ×3
KIT PINK PAD W/HEAD ARM REST (MISCELLANEOUS) ×2 IMPLANT
KIT TURNOVER CYSTO (KITS) ×3 IMPLANT
LABEL OR SOLS (LABEL) ×3 IMPLANT
MANIFOLD NEPTUNE II (INSTRUMENTS) ×3 IMPLANT
MANIPULATOR UTERINE 4.5 ZUMI (MISCELLANEOUS) ×1 IMPLANT
MANIPULATOR VCARE LG CRV RETR (MISCELLANEOUS) IMPLANT
MANIPULATOR VCARE SML CRV RETR (MISCELLANEOUS) IMPLANT
MANIPULATOR VCARE STD CRV RETR (MISCELLANEOUS) IMPLANT
NEEDLE HYPO 22GX1.5 SAFETY (NEEDLE) ×3 IMPLANT
NS IRRIG 1000ML POUR BTL (IV SOLUTION) ×6 IMPLANT
OBTURATOR OPTICAL STANDARD 8MM (TROCAR) ×3
OBTURATOR OPTICAL STND 8 DVNC (TROCAR) ×2
OBTURATOR OPTICALSTD 8 DVNC (TROCAR) ×2 IMPLANT
OCCLUDER COLPOPNEUMO (BALLOONS) ×3 IMPLANT
PACK LAP CHOLECYSTECTOMY (MISCELLANEOUS) ×3 IMPLANT
PAD ARMBOARD 7.5X6 YLW CONV (MISCELLANEOUS) ×3 IMPLANT
PAD OB MATERNITY 4.3X12.25 (PERSONAL CARE ITEMS) ×3 IMPLANT
PAD PREP 24X41 OB/GYN DISP (PERSONAL CARE ITEMS) ×3 IMPLANT
SCRUB CHG 4% DYNA-HEX 4OZ (MISCELLANEOUS) ×2 IMPLANT
SEAL CANN UNIV 5-8 DVNC XI (MISCELLANEOUS) ×6 IMPLANT
SEAL XI 5MM-8MM UNIVERSAL (MISCELLANEOUS) ×9
SEALER VESSEL DA VINCI XI (MISCELLANEOUS) ×3
SEALER VESSEL EXT DVNC XI (MISCELLANEOUS) IMPLANT
SOLUTION ELECTROLUBE (MISCELLANEOUS) ×3 IMPLANT
SPONGE T-LAP 18X18 ~~LOC~~+RFID (SPONGE) ×3 IMPLANT
SURGILUBE 2OZ TUBE FLIPTOP (MISCELLANEOUS) ×3 IMPLANT
SUT DVC VLOC 180 0 12IN GS21 (SUTURE)
SUT MNCRL 4-0 (SUTURE) ×6
SUT MNCRL 4-0 27XMFL (SUTURE) ×4
SUT VIC AB 0 CT2 27 (SUTURE) ×2 IMPLANT
SUT VIC AB 1 CT1 36 (SUTURE) ×4 IMPLANT
SUT VIC AB 2-0 CT1 27 (SUTURE)
SUT VIC AB 2-0 CT1 TAPERPNT 27 (SUTURE) ×2 IMPLANT
SUT VIC AB 4-0 SH 27 (SUTURE) ×30
SUT VIC AB 4-0 SH 27XANBCTRL (SUTURE) ×4 IMPLANT
SUT VICRYL 0 AB UR-6 (SUTURE) ×2 IMPLANT
SUTURE DVC VLC 180 0 12IN GS21 (SUTURE) ×4 IMPLANT
SUTURE MNCRL 4-0 27XMF (SUTURE) IMPLANT
SYR 10ML LL (SYRINGE) ×3 IMPLANT
SYR 50ML LL SCALE MARK (SYRINGE) ×3 IMPLANT
TUBING EVAC SMOKE HEATED PNEUM (TUBING) ×3 IMPLANT
WAND RF SURG SPNG DETECT SYS (INSTRUMENTS) ×3 IMPLANT
WATER STERILE IRR 500ML POUR (IV SOLUTION) ×3 IMPLANT

## 2021-08-31 NOTE — Op Note (Addendum)
Operative Note    Name: Ann Morgan  Date of Service: 08/31/2021  DOB: 12-24-1970  MRN: [id number]   Pre-Operative Diagnosis:  1) Left lower quadrant abdominal pain [R10.32] 2) Left ovarian cyst [N83.202] 3) Ovary, ovarian pedicle and fallopian tube torsion [N83.53]  Post-Operative Diagnosis:  1) Left lower quadrant abdominal pain [R10.32] 2) Left ovarian cyst [N83.202] 3) Ovary, ovarian pedicle and fallopian tube torsion [N83.53]  Procedures:  1. Robot assisted right salpingo-oophorectomy, cystectomy 2. Cystoscopy  Primary Surgeon: Prentice Docker, MD   EBL: 15 mL   IVF: 1,200 mL   Urine output: 50 mL  Specimens: left ovary, fallopian tube, and cyst  Drains: none  Complications: None   Disposition: PACU   Condition: Stable   Findings:  1) Grossly enlarged and ischemic left ovary (~9 cm). No viable ovarian tissue identified even 30 minutes after reducing the torsion of the ovary 2) Adherence of bowel, bladder, uterus, and pelvic side wall to the ovarian cyst 3) Torsion of pedicle to ovary and fallopian tube 4) Inflammation of surrounding structures  5) Right posterior lower uterine fibroid 6) Normal appearing bladder with no evidence of bladder wall defect. Also noted was efflux of urine from the bilateral ureteral orifices.   Procedure Summary:  The patient was taken to the operating room where general anesthesia was administered and found to be adequate. She was placed in the dorsal supine lithotomy position in [place] stirrups and prepped and draped in usual sterile fashion. After a timeout was called an indwelling catheter was placed in her bladder.  A sterile speculum was placed in her vagina.  The single-tooth tenaculum was used to grasp the anterior lip of the cervix.  The cervix was serially dilated using [place]att dilators.  The ZUMI uterine manipulator was inserted in accordance to the manufacturer's recommendations.  The tenaculum and speculum were  removed.  Attention was turned to the abdomen where after injection of local anesthetic, an 12 mm supraumbilical incision was made with the scalpel. Entry into the abdomen was obtained via Optiview trocar technique (a blunt entry technique with camera visualization through the obturator upon entry). Verification of entry into the abdomen was obtained using opening pressures. The abdomen was insufflated with CO2. The camera was introduced through the trocar with verification of atraumatic entry.  Right and left abdominal entry sites were created after injection of local anesthetic about 8 cm away from the umbilical port in accordance with the Intuitive manufacturer's recommendations.  The port sites were 8 mm.  The intuitive trochars were introduced under intra-abdominal camera visualization without difficulty.  The 8 mm trocar at the umbilicus was exchanged for a 12 mm trocar under direct, intra-abdominal camera visualization.  The cyst was drained under direct visualization using suction after puncturing with a needle attached to the suction.  The robot was docked from the patient's left side.  Attached to arm 4 was the [place] forceps.  Attached to arm 3 was the camera.  Attached arm to was the forced bipolar forceps.  Initially, the camera was inserted through the supraumbilical trocar and targeting was performed prior to attaching the robotic arms.  Attention was turned to the pelvis.  The above-noted findings were appreciated.  Though there was a large amount of inflammation and adherent tissue to the cyst, this was easily reduced with gentle traction.  Once the left infundibulopelvic region was identified the torsion was diagnosed.  The area was detorsed.  I continue to take down adherent tissue.  Upon manipulating the  tissue of the ovary there was easy tearing and no bleeding from the areas torn.  I monitored the area for about half an hour with no return of blood flow.  An attempt was made to  visualize the ureter and with only moderate confidence was the ureter visualized.  Since I could not completely see the ureter with confidence, I decided to transect the ovary at a level way above the infundibulopelvic ligament into the broad ligament.  I then cauterized and transected along the broad ligament to the level of the left cornu.  This freed up the majority of the cyst.  The left infundibulopelvic ligament was identified and elevating the ligament with no ureter involvement the infundibulopelvic ligament was cauterized and transected.  Hemostasis was obtained at this point, though there was no active bleeding.  With the specimen entirely removed from its attachments, the camera was moved to arm 4 and the specimen retrieval bag was passed through the 12 mm port.  Specimen was placed in its entirety into the bag.  Pressure in the abdomen was lowered to 5 mmHg and hemostasis was again verified.  All extra fluid was evacuated from the pelvis.  The robot was undocked and the specimen was removed through the supra-umbilical trocar directly from the bag.  The supraumbilical fascia was closed using a single 0 Vicryl stitch.  The abdomen was emptied of CO2.  The remaining trocars were removed.  The subcutaneous tissue was reapproximated using 4-0 Monocryl.  The skin at the supraumbilical site was closed in a subcuticular fashion using 4-0 Monocryl.  All incision sites were reinforced using surgical skin glue.  Attention was turned to the pelvis.  The [place] manipulator was removed.  The catheter was removed from her bladder.  The 70 degree cystoscope was gently inserted through the urethra.  The bladder was distended using lactated Ringer's for about 200 mL.  There were no defects in the bladder.  Efflux from the bilateral ureteral orifices was noted.  The bladder was drained of fluid.  The speculum was inserted in the vagina and the tenaculum entry sites were verified to be hemostatic along with the fact that  there was no bleeding from the cervical os.  The speculum was removed after verifying no instruments or sponges were left in the vagina.  The patient tolerated the procedure well.  Sponge, lap, needle, and instrument counts were correct x 2.  VTE prophylaxis: SCDs. Antibiotic prophylaxis: None indicated and none given. She was awakened in the operating room and was taken to the PACU in stable condition.   Prentice Docker, MD 08/31/2021 3:19 PM

## 2021-08-31 NOTE — Transfer of Care (Signed)
Immediate Anesthesia Transfer of Care Note  Patient: Ann Morgan  Procedure(s) Performed: XI ROBOTIC ASSISTED LAPAROSCOPIC LEFT SALPINGO OOPHORECTOMY CYSTECTOMY (Left) CYSTOSCOPY XI ROBOTIC ASSISTED LAPAROSCOPIC LYSIS OF ADHESION  Patient Location: PACU  Anesthesia Type:General  Level of Consciousness: awake and patient cooperative  Airway & Oxygen Therapy: Patient Spontanous Breathing and Patient connected to face mask  Post-op Assessment: Report given to RN and Post -op Vital signs reviewed and stable  Post vital signs: Reviewed and stable  Last Vitals:  Vitals Value Taken Time  BP 120/91 08/31/21 1525  Temp    Pulse 87 08/31/21 1526  Resp 17 08/31/21 1528  SpO2 100 % 08/31/21 1526  Vitals shown include unvalidated device data.  Last Pain:  Vitals:   08/31/21 1049  TempSrc: Temporal  PainSc: 0-No pain         Complications: No notable events documented.

## 2021-08-31 NOTE — Interval H&P Note (Signed)
History and Physical Interval Note:  08/31/2021 12:46 PM  Ann Morgan  has presented today for surgery, with the diagnosis of left ovarian cyst, left lower quadrant abdominal pain.  The various methods of treatment have been discussed with the patient and family. After consideration of risks, benefits and other options for treatment, the patient has consented to  Procedure(s): XI ROBOTIC [place], possible LEFT SALPINGO OOPHORECTOMY (Left) as a surgical intervention.  The patient's history has been reviewed, patient examined, no change in status, stable for surgery.  I have reviewed the patient's chart and labs.  Questions were answered to the patient's satisfaction.  She is feeling much better today. We discussed that she is either feeling better because she has no torsion at this time, her tissue has been too ischemic and no longer has sensation. It is unlikely that the cyst has completely resolved. She would like to proceed with removal of the cyst and is aware that the ovary may need to be removed as well (along with the fallopian tube). Consents reviewed and signed. As noted above, she wishes to proceed.   Prentice Docker, MD, [facility] 08/31/2021 12:48 PM

## 2021-08-31 NOTE — Anesthesia Procedure Notes (Signed)
Procedure Name: Intubation Date/Time: 08/31/2021 12:58 PM  Performed by: Carmelina Paddock, RNPre-anesthesia Checklist: Patient identified, Emergency Drugs available, Suction available and Patient being monitored Patient Re-evaluated:Patient Re-evaluated prior to induction Oxygen Delivery Method: Circle system utilized Preoxygenation: Pre-oxygenation with 100% oxygen Induction Type: IV induction Ventilation: Mask ventilation without difficulty Laryngoscope Size: McGraph and 4 Grade View: Grade I Tube type: Oral Tube size: 7.0 mm Number of attempts: 1 Airway Equipment and Method: Stylet and Oral airway Placement Confirmation: ETT inserted through vocal cords under direct vision, positive ETCO2 and breath sounds checked- equal and bilateral Secured at: 22 cm Tube secured with: Tape Dental Injury: Teeth and Oropharynx as per pre-operative assessment

## 2021-08-31 NOTE — Discharge Instructions (Addendum)
AMBULATORY SURGERY  DISCHARGE INSTRUCTIONS   The drugs that you were given will stay in your system until tomorrow so for the next 24 hours you should not:  Drive an automobile Make any legal decisions Drink any alcoholic beverage   You may resume regular meals tomorrow.  Today it is better to start with liquids and gradually work up to solid foods.  You may eat anything you prefer, but it is better to start with liquids, then soup and crackers, and gradually work up to solid foods.   Please notify your doctor immediately if you have any unusual bleeding, trouble breathing, redness and pain at the surgery site, drainage, fever, or pain not relieved by medication.                    

## 2021-08-31 NOTE — Anesthesia Preprocedure Evaluation (Addendum)
Anesthesia Evaluation  Patient identified by MRN, date of birth, ID band Patient awake    Reviewed: Allergy & Precautions, NPO status , Patient's Chart, lab work & pertinent test results  Airway Mallampati: III  TM Distance: >3 FB Neck ROM: full    Dental no notable dental hx.    Pulmonary asthma ,    Pulmonary exam normal        Cardiovascular hypertension, Pt. on medications Normal cardiovascular exam     Neuro/Psych negative neurological ROS  negative psych ROS   GI/Hepatic negative GI ROS, Neg liver ROS,   Endo/Other  negative endocrine ROS  Renal/GU      Musculoskeletal   Abdominal (+) + obese,   Peds  Hematology negative hematology ROS (+)   Anesthesia Other Findings Past Medical History: No date: Abnormal Pap smear of cervix No date: Asthma No date: Endometrial hyperplasia 2004: Hypertension No date: Irregular menses 10/19/2013: Mild intermittent asthma with acute exacerbation     Comment:  Late 2013 with a cough that lasted approximately one               month.  PFTs showed FEV1 of 83%  Last Assessment & Plan:               Reports that since she was prescribed albuterol, she has               used it maybe 1x to see how she would respond to it which              seemed to help. However, she still has some coughing               issues but thinks it is manageable. No issues with high               pollen season.   Plan: - Continue with albuterol PRN.  -               She will No date: Obesity  Past Surgical History: 2015: BREAST CYST ASPIRATION; Left     Comment:  benign jan 2011: KNEE SURGERY; Left     Comment:  orthoscopic 12/25/2018: SHOULDER ARTHROSCOPY WITH SUBACROMIAL DECOMPRESSION,  ROTATOR CUFF REPAIR AND BICEP TENDON REPAIR; Right     Comment:  Procedure: right shoulder arthroscopy, biceps tendon               release with tenodesis, three tendon rotator cuff repair;               Surgeon: Meredith Pel, MD;  Location: [place];                Service: Orthopedics;  Laterality: Right;     Reproductive/Obstetrics left ovarian cyst, left lower quadrant abdominal pain                            Anesthesia Physical Anesthesia Plan  ASA: 2  Anesthesia Plan: General ETT   Post-op Pain Management: Toradol IV (intra-op)* and Ofirmev IV (intra-op)*   Induction: Intravenous  PONV Risk Score and Plan: 4 or greater and Ondansetron, Dexamethasone, Midazolam and Promethazine  Airway Management Planned: Oral ETT  Additional Equipment:   Intra-op Plan:   Post-operative Plan: Extubation in OR  Informed Consent: I have reviewed the patients History and Physical, chart, labs and discussed the procedure including the risks, benefits and alternatives for the proposed anesthesia with the patient  or authorized representative who has indicated his/her understanding and acceptance.     Dental Advisory Given  Plan Discussed with: Anesthesiologist, CRNA and Surgeon  Anesthesia Plan Comments:       Anesthesia Quick Evaluation

## 2021-08-31 NOTE — Anesthesia Postprocedure Evaluation (Signed)
Anesthesia Post Note  Patient: Ann Morgan  Procedure(s) Performed: XI ROBOTIC ASSISTED LAPAROSCOPIC LEFT SALPINGO OOPHORECTOMY CYSTECTOMY (Left) CYSTOSCOPY XI ROBOTIC ASSISTED LAPAROSCOPIC LYSIS OF ADHESION  Patient location during evaluation: PACU Anesthesia Type: General Level of consciousness: combative Pain management: pain level controlled Vital Signs Assessment: post-procedure vital signs reviewed and stable Respiratory status: spontaneous breathing, nonlabored ventilation and respiratory function stable Cardiovascular status: blood pressure returned to baseline and stable Postop Assessment: no apparent nausea or vomiting Anesthetic complications: no   No notable events documented.   Last Vitals:  Vitals:   08/31/21 1600 08/31/21 1620  BP: (!) 119/94 125/80  Pulse: 80 72  Resp: 17 18  Temp: (!) 36.4 C 36.6 C  SpO2: 96% 100%    Last Pain:  Vitals:   08/31/21 1620  TempSrc: Temporal  PainSc: 0-No pain                 [place] Ouch

## 2021-09-01 ENCOUNTER — Encounter: Payer: Self-pay | Admitting: Obstetrics and Gynecology

## 2021-09-04 LAB — SURGICAL PATHOLOGY

## 2021-09-05 ENCOUNTER — Ambulatory Visit: Payer: BC Managed Care – PPO

## 2021-11-09 ENCOUNTER — Ambulatory Visit (INDEPENDENT_AMBULATORY_CARE_PROVIDER_SITE_OTHER): Payer: BC Managed Care – PPO | Admitting: Nurse Practitioner

## 2021-11-09 ENCOUNTER — Encounter: Payer: Self-pay | Admitting: Nurse Practitioner

## 2021-11-09 ENCOUNTER — Other Ambulatory Visit: Payer: Self-pay

## 2021-11-09 VITALS — BP 128/86 | HR 81 | Temp 99.0°F | Resp 16 | Ht 69.0 in | Wt 286.7 lb

## 2021-11-09 DIAGNOSIS — E278 Other specified disorders of adrenal gland: Secondary | ICD-10-CM | POA: Diagnosis not present

## 2021-11-09 DIAGNOSIS — M25561 Pain in right knee: Secondary | ICD-10-CM | POA: Diagnosis not present

## 2021-11-09 DIAGNOSIS — I10 Essential (primary) hypertension: Secondary | ICD-10-CM

## 2021-11-09 DIAGNOSIS — Z23 Encounter for immunization: Secondary | ICD-10-CM

## 2021-11-09 DIAGNOSIS — E279 Disorder of adrenal gland, unspecified: Secondary | ICD-10-CM | POA: Insufficient documentation

## 2021-11-09 NOTE — Addendum Note (Signed)
Addended by: Serafina Royals F on: 11/09/2021 11:34 AM   Modules accepted: Orders

## 2021-11-09 NOTE — Progress Notes (Addendum)
BP 128/86   Pulse 81   Temp 99 F (37.2 C)   Resp 16   Ht '5\' 9"'$  (1.753 m)   Wt 286 lb 11.2 oz (130 kg)   SpO2 98%   BMI 42.34 kg/m    Subjective:    Patient ID: Ann Morgan, female    DOB: 12-Oct-1970, 51 y.o.   MRN: [id number]  HPI: Ann Morgan is a 51 y.o. female  Chief Complaint  Patient presents with   Consult    Discuss  scheduling MRI and CT Scan from ER   Hypertension   Adrenal nodules: Patient was seen on 08/25/2021 for ER follow-up.  She had been seen by Dr. Ky Barban on 08/23/2021.  She was seen for complaint of left lower quadrant abdominal pain that started the day before.  She described it as dull or sharp in character.  She did experience some nausea and vomiting.  Labs were completed at that time an ultrasound was ordered.  However while the patient was waiting to get her ultrasound done the pain increase that she is went to the emergency department on 08/24/2021.  She had a pelvic ultrasound done which showed 8.6 cm simple appearing left adnexal cyst.  Recommended follow-up ultrasound in 3 to 6 months.  Technically difficult arterial Doppler evaluation of the right ovary due to location, although if favored to be within normal limits consider repeat evaluation with short-term follow-up pelvic ultrasound.  6.7 cm uterine fibroid. She also had a CT abdomen pelvis done which showed no acute intra-abdominal or pelvic finding by CT.  2 indeterminate small left adrenal nodules were noted and it was recommended that she get a follow-up nonemergent MRI for closer look.  MRI was ordered.  It also showed an 8.6 cm simple appearing likely ovarian and adnexal cyst.  Also showed fibroid uterus, trace pelvic free fluid likely physiologic.  She did have an appointment with GYN at 130 the same day that she saw me.  That appointment they discussed close follow-up to surgery.  Surgery vs close follow up discussed. She prefers close follow up. So, appt in 3 days.  She does not have  a surgical abdomen at this point. We discussed that she could have either a rapidly-expanding cyst, which would cause pain. More likely she has some element of intermittent torsion of the vascular pedicle to her left ovary. We discussed that with true torsion we would treat this as a surgical emergency. However, she did have blood flow to the left ovary on ultrasound yesterday. We discussed that this finding on ultrasound does not always mean that torsion is not present. In either case, her pain appears to be improving and she does not want to be scheduled for surgery at the moment. I want to have close follow up with her. So, I've asked her to return to clinic in 3 days. At that time, if her pain is not improved, I would recommend getting her into the OR next week, ASAP, to assess her left ovary.   She was then seen by OB/GYN on 08/28/2021.  At which time they decided to go ahead with the surgery to remove the left ovarian cyst.  He had a surgery on 08/31/2021.LEFT FALLOPIAN TUBE AND OVARY; SALPINGO-OOPHORECTOMY:  - HEMORRHAGIC ISCHEMIC NECROSIS OF OVARY, OVARIAN CYST, AND FALLOPIAN  TUBE.  MRI was already ordered for evaluation of adrenal nodules, patient provided number to scheduling.   Was later called and requested to place new order due to approval  expiring.  Upon placing new order, insurance company required peer to peer.  Peer to peer performed.  Insurance provider stated patient will not be approved and will need to wait for 12 months to get MRI.    Right knee pain:  She says that she hurt her knee while playing tennis and pickle ball. She says that she has been having trouble walking up and down the stairs. She says that she also has pain with bending. Dr. Marlou Sa is her orthopedic. Discussed with patient to reach out to her orthopedic for evaluation.   Hypertension:  Her blood pressure today is 128/86. She currently takes triamterene-hydrochlorothiazide 37.5-25 mg daily.  She denies any chest pain,  shortness of breath, headaches or blurred vision.  We will continue with current treatment plan.  Relevant past medical, surgical, family and social history reviewed and updated as indicated. Interim medical history since our last visit reviewed. Allergies and medications reviewed and updated.  Review of Systems  Constitutional: Negative for fever or weight change.  Respiratory: Negative for cough and shortness of breath.   Cardiovascular: Negative for chest pain or palpitations.  Gastrointestinal: Negative for abdominal pain, no bowel changes.  Musculoskeletal: Negative for gait problem or joint swelling. Positive right knee pain Skin: Negative for rash.  Neurological: Negative for dizziness or headache.  No other specific complaints in a complete review of systems (except as listed in HPI above).      Objective:    BP 128/86   Pulse 81   Temp 99 F (37.2 C)   Resp 16   Ht '5\' 9"'$  (1.753 m)   Wt 286 lb 11.2 oz (130 kg)   SpO2 98%   BMI 42.34 kg/m   Wt Readings from Last 3 Encounters:  11/09/21 286 lb 11.2 oz (130 kg)  08/31/21 270 lb (122.5 kg)  08/30/21 270 lb (122.5 kg)    Physical Exam  Constitutional: Patient appears well-developed and well-nourished. Obese  No distress.  HEENT: head atraumatic, normocephalic, pupils equal and reactive to light, neck supple Cardiovascular: Normal rate, regular rhythm and normal heart sounds.  No murmur heard. No BLE edema. Pulmonary/Chest: Effort normal and breath sounds normal. No respiratory distress. Abdominal: Soft.  There is no tenderness. LTJ:QZESP knee tenderness, decreased ROM Psychiatric: Patient has a normal mood and affect. behavior is normal. Judgment and thought content normal.  Results for orders placed or performed during the hospital encounter of 08/31/21  Pregnancy, urine POC  Result Value Ref Range   Preg Test, Ur NEGATIVE NEGATIVE  I-STAT, chem 8  Result Value Ref Range   Sodium 139 135 - 145 mmol/L   Potassium  3.6 3.5 - 5.1 mmol/L   Chloride 100 98 - 111 mmol/L   BUN 13 6 - 20 mg/dL   Creatinine, Ser 1.00 0.44 - 1.00 mg/dL   Glucose, Bld 100 (H) 70 - 99 mg/dL   Calcium, Ion 1.15 1.15 - 1.40 mmol/L   TCO2 28 22 - 32 mmol/L   Hemoglobin 12.9 12.0 - 15.0 g/dL   HCT 38.0 36.0 - 46.0 %  ABO/Rh  Result Value Ref Range   ABO/RH(D)      O POS Performed at [facility], [address], [place], [place] [address]   Surgical pathology  Result Value Ref Range   SURGICAL PATHOLOGY      SURGICAL PATHOLOGY CASE: [id number] PATIENT: [facility]ology Report     Specimen Submitted: A. Fallopian tube and ovary with cyst, left  Clinical History: Left ovarian cyst, left lower quadrant abdominal pain Findings: Torsion of left ovary, ovarian pedicle, and fallopian tube; left ovarian cyst with adhesions     DIAGNOSIS: A. LEFT FALLOPIAN TUBE AND OVARY; SALPINGO-OOPHORECTOMY: - HEMORRHAGIC ISCHEMIC NECROSIS OF OVARY, OVARIAN CYST, AND FALLOPIAN TUBE.  Comment: The features are consistent with necrosis due to torsion. The type of cyst cannot be determined due to necrosis, but it was a simple cyst on imaging, and thin-walled on gross examination.  GROSS DESCRIPTION: A. Labeled: Left fallopian tube/ovary cyst Received: Fresh Collection time: 2:50 PM on 08/31/2021 Placed into formalin time: 4:04 PM on 08/31/2021 Type of procedure: Laparoscopic left salpingo-oophorectomy Integrity: Previously disrupted Weight of specimen: 40.1  grams Size of specimen:           Ovary: Aggregate, 7.5 x 6.0 x 3.0 cm           Fallopian tube: 8.5 x 0.7 cm Ovarian surface: Brown to purple and dull.  Approximately 80% of the ovarian tissue is consistent of a thin, smooth hemorrhagic cyst wall. Possible ovarian parenchyma is identified with red to brown, smooth cut surfaces. Fallopian tube lumen: The fallopian tube is brown and dull.  Sectioning shows brown to red cut surfaces  with a stenotic lumen  Block summary: 1 -representative cross-sections of fallopian tube 2-entire fimbriated end 3-6-representative ovary and cyst wall  CM 09/01/2021  Final Diagnosis performed by Bryan Lemma, MD.   Electronically signed 09/04/2021 6:18:40PM The electronic signature indicates that the named Attending Pathologist has evaluated the specimen Technical component performed at [facility], [address] [place], [place] [address] Lab: [phone] Dir: Rush Farmer, MD, MMM  Professional component performed at [facility], [facility], [place], [place],  [address] Lab: [phone] Dir: Kathi Simpers, MD       Assessment & Plan:   Problem List Items Addressed This Visit       Cardiovascular and Mediastinum   Essential hypertension    Blood pressure 128/86 today.  Continue taking triamterene-hydrochlorothiazide 37.5-25 mg daily.        Other   Adrenal nodule ([place]) - Primary    MRI was ordered further evaluation of adrenal nodule at last visit.  Patient given number for scheduling.      Relevant Orders   MR Abdomen W Wo Contrast   Other Visit Diagnoses     Need for influenza vaccination       Relevant Orders   Flu Vaccine QUAD 6+ mos PF IM (Fluarix Quad PF) (Completed)   Acute pain of right knee       Patient is going to reach out to her orthopedic Dr. Marlou Sa.       MRI was reordered due to authorization had expired.  Upon placing new order, insurance company required peer to peer.  Peer to peer performed.  Insurance provider stated patient will not be approved and will need to wait for 12 months to get MRI.  Patient will be contacted.   Follow up plan: Return in about 6 months (around 05/12/2022) for follow up.

## 2021-11-09 NOTE — Assessment & Plan Note (Signed)
MRI was ordered further evaluation of adrenal nodule at last visit.  Patient given number for scheduling.

## 2021-11-09 NOTE — Assessment & Plan Note (Signed)
Blood pressure 128/86 today.  Continue taking triamterene-hydrochlorothiazide 37.5-25 mg daily.

## 2021-11-10 ENCOUNTER — Other Ambulatory Visit: Payer: Self-pay

## 2021-11-10 DIAGNOSIS — M1711 Unilateral primary osteoarthritis, right knee: Secondary | ICD-10-CM

## 2021-11-13 ENCOUNTER — Other Ambulatory Visit: Payer: Self-pay | Admitting: Nurse Practitioner

## 2021-11-13 DIAGNOSIS — E278 Other specified disorders of adrenal gland: Secondary | ICD-10-CM

## 2021-11-16 ENCOUNTER — Other Ambulatory Visit: Payer: BC Managed Care – PPO

## 2021-11-23 ENCOUNTER — Ambulatory Visit
Admission: RE | Admit: 2021-11-23 | Discharge: 2021-11-23 | Disposition: A | Discharge status: Home / Self Care | Payer: BC Managed Care – PPO | Source: Ambulatory Visit | Attending: Orthopedic Surgery | Admitting: Orthopedic Surgery

## 2021-11-23 DIAGNOSIS — M1711 Unilateral primary osteoarthritis, right knee: Secondary | ICD-10-CM

## 2021-11-23 DIAGNOSIS — M25461 Effusion, right knee: Secondary | ICD-10-CM | POA: Diagnosis not present

## 2021-11-23 DIAGNOSIS — S83281A Other tear of lateral meniscus, current injury, right knee, initial encounter: Secondary | ICD-10-CM | POA: Diagnosis not present

## 2021-11-28 ENCOUNTER — Ambulatory Visit (INDEPENDENT_AMBULATORY_CARE_PROVIDER_SITE_OTHER): Payer: BC Managed Care – PPO | Admitting: Internal Medicine

## 2021-11-28 ENCOUNTER — Encounter: Payer: Self-pay | Admitting: Internal Medicine

## 2021-11-28 VITALS — BP 122/80 | HR 72 | Ht 69.0 in | Wt 285.4 lb

## 2021-11-28 DIAGNOSIS — D3502 Benign neoplasm of left adrenal gland: Secondary | ICD-10-CM

## 2021-11-28 LAB — BASIC METABOLIC PANEL
BUN: 16 mg/dL (ref 6–23)
CO2: 28 mEq/L (ref 19–32)
Calcium: 9.3 mg/dL (ref 8.4–10.5)
Chloride: 102 mEq/L (ref 96–112)
Creatinine, Ser: 0.88 mg/dL (ref 0.40–1.20)
GFR: 77.07 mL/min (ref >60.00)
Glucose, Bld: 84 mg/dL (ref 70–99)
Potassium: 3.6 mEq/L (ref 3.5–5.1)
Sodium: 138 mEq/L (ref 135–145)

## 2021-11-28 NOTE — Patient Instructions (Signed)

## 2021-11-28 NOTE — Progress Notes (Signed)
Name: Ann Morgan  MRN/ DOB: [id number], 1970/06/22    Age/ Sex: 51 y.o., female    PCP: Bo Merino, FNP   Reason for Endocrinology Evaluation: Left adrenal adenoma     Date of Initial Endocrinology Evaluation: 11/28/2021     HPI: Ms. Braniya Farrugia is a 51 y.o. female with a past medical history of HTN, asthma. The patient presented for initial endocrinology clinic visit on 11/28/2021 for consultative assistance with her left adrenal adenoma.   During evaluation for abdominal pain the patient has been noted on CT imaging to have 2 left adrenal nodules, one of them is 1 cm and the other is 1.4 cm.  She is S/P left ovarian cystectomy 08/2021    Adrenal incidentaloma: Substantial weight gain- weight has been fluctuating  Centripetal obesity-no Easy bruisability- yes  Severe hypertension-no DM- no Proximal muscle weakness-no Sudden/ severe headaches-no Weight loss-no Anxiety attacks-no Sweating-no Cardiac arrhythmias-no Palpitations-no Fluid retention- no Hypokalemia-yes   Has right leg weakness after prolonged standing, has varicose veins     HISTORY:  Past Medical History:  Past Medical History:  Diagnosis Date   Abnormal Pap smear of cervix    Asthma    Endometrial hyperplasia    Hypertension 2004   Irregular menses    Mild intermittent asthma with acute exacerbation 10/19/2013   Late 2013 with a cough that lasted approximately one month.  PFTs showed FEV1 of 83%  Last Assessment & Plan:  Reports that since she was prescribed albuterol, she has used it maybe 1x to see how she would respond to it which seemed to help. However, she still has some coughing issues but thinks it is manageable. No issues with high pollen season.   Plan: - Continue with albuterol PRN.  - She will   Obesity    Past Surgical History:  Past Surgical History:  Procedure Laterality Date   BREAST CYST ASPIRATION Left 2015   benign   CYSTOSCOPY  08/31/2021   Procedure:  CYSTOSCOPY;  Surgeon: Will Bonnet, MD;  Location: [facility];  Service: Gynecology;;   KNEE SURGERY Left jan 2011   orthoscopic   ROBOTIC ASSISTED LAPAROSCOPIC LYSIS OF ADHESION  08/31/2021   Procedure: XI ROBOTIC ASSISTED LAPAROSCOPIC LYSIS OF ADHESION;  Surgeon: Will Bonnet, MD;  Location: [facility];  Service: Gynecology;;   ROBOTIC ASSISTED LAPAROSCOPIC OVARIAN CYSTECTOMY Left 08/31/2021   Procedure: XI ROBOTIC ASSISTED LAPAROSCOPIC LEFT SALPINGO OOPHORECTOMY CYSTECTOMY;  Surgeon: Will Bonnet, MD;  Location: [facility];  Service: Gynecology;  Laterality: Left;   SHOULDER ARTHROSCOPY WITH SUBACROMIAL DECOMPRESSION, ROTATOR CUFF REPAIR AND BICEP TENDON REPAIR Right 12/25/2018   Procedure: right shoulder arthroscopy, biceps tendon release with tenodesis, three tendon rotator cuff repair;  Surgeon: Meredith Pel, MD;  Location: [facility];  Service: Orthopedics;  Laterality: Right;    Social History:  reports that she has never smoked. She has never used smokeless tobacco. She reports that she does not drink alcohol and does not use drugs. Family History: family history includes Breast cancer (age of onset: 7) in her maternal grandmother; Diabetes in her brother; Hypertension in her brother and mother.   HOME MEDICATIONS: Allergies as of 11/28/2021   No Known Allergies      Medication List        Accurate as of November 28, 2021  7:23 PM. If you have any questions, ask your nurse or doctor.          STOP taking these medications  ibuprofen 600 MG tablet Commonly known as: ADVIL Stopped by: Dorita Sciara, MD       TAKE these medications    prenatal multivitamin Tabs tablet Take 1 tablet by mouth daily.   triamterene-hydrochlorothiazide 37.5-25 MG capsule Commonly known as: DYAZIDE Take 1 each (1 capsule total) by mouth every morning.          REVIEW OF SYSTEMS: A comprehensive ROS was conducted with the patient and is negative except as per  HPI and below:  ROS     OBJECTIVE:  VS: BP 122/80 (BP Location: Left Arm, Patient Position: Sitting, Cuff Size: Large)   Pulse 72   Ht '5\' 9"'$  (1.753 m)   Wt 285 lb 6.4 oz (129.5 kg)   SpO2 99%   BMI 42.15 kg/m    Wt Readings from Last 3 Encounters:  11/28/21 285 lb 6.4 oz (129.5 kg)  11/09/21 286 lb 11.2 oz (130 kg)  08/31/21 270 lb (122.5 kg)     EXAM: General: Pt appears well and is in NAD  Eyes: External eye exam normal without stare, lid lag or exophthalmos.  EOM intact.  PERRL.  Neck: General: Supple without adenopathy. Thyroid: Thyroid size normal.  No goiter or nodules appreciated. No thyroid bruit.  Lungs: Clear with good BS bilat with no rales, rhonchi, or wheezes  Heart: Auscultation: RRR.  Abdomen: Normoactive bowel sounds, soft, nontender, without masses or organomegaly palpable  Extremities:  BL LE: No pretibial edema normal ROM and strength.  Mental Status: Judgment, insight: Intact Orientation: Oriented to time, place, and person Mood and affect: No depression, anxiety, or agitation     DATA REVIEWED:     Latest Reference Range & Units 11/28/21 13:55  Sodium 135 - 145 mEq/L 138  Potassium 3.5 - 5.1 mEq/L 3.6  Chloride 96 - 112 mEq/L 102  CO2 19 - 32 mEq/L 28  Glucose 70 - 99 mg/dL 84  BUN 6 - 23 mg/dL 16  Creatinine 0.40 - 1.20 mg/dL 0.88  Calcium 8.4 - 10.5 mg/dL 9.3  GFR >60.00 mL/min 77.07   CT abdomen 08/24/2021   Adrenals/Urinary Tract: Both kidneys demonstrate a few scattered tiny subcentimeter cortical renal cysts. No further imaging follow-up recommended. No hydronephrosis. No acute obstructive uropathy, perinephric inflammation or associated hydroureter. Ureters are symmetric and decompressed. Bladder collapsed.   Two small left adrenal nodules 1 measuring 17 mm and a second measuring 14 mm. No available comparison studies. Lesions are indeterminate by contrast CT but certainly could be adenomas. Recommend follow-up nonemergent  imaging with MRI utilizing an adrenal protocol.   Normal right adrenal gland    ASSESSMENT/PLAN/RECOMMENDATIONS:   Left adrenal Adenoma    Eighty five percent of adrenal adenomas are nonsecretory.  - Three forms of adrenal hyperfunction should be considered in patients with adrenal incidentaloma  Glucocorticoid hypersecretion Primary hyperaldosteronism Pheochromocytoma   - Will proceed with aldo, renin and 24 hr urinary cortisol and catecholamines  - Will repeat imaging in 08/2022  F/U in 1 yr or sooner pending lab results   Signed electronically by: Mack Guise, MD  [facility]  [address] [place], [place] [address] Phone: [phone] FAX: [phone]   CC: Bo Merino, [place] [address] Phone: [phone] Fax: [phone]   Return to Endocrinology clinic as below: Future Appointments  Date Time Provider [place]  11/30/2021  1:15 PM Magnant, Gerrianne Scale, PA-C [facility] None  05/11/2022  9:00 AM Pender, Reardan [facility]  08/31/2022  2:00 PM Kelvis Berger, Melanie Crazier, MD [facility] None

## 2021-11-30 ENCOUNTER — Other Ambulatory Visit: Payer: BC Managed Care – PPO

## 2021-11-30 ENCOUNTER — Encounter: Payer: Self-pay | Admitting: Surgical

## 2021-11-30 ENCOUNTER — Ambulatory Visit (INDEPENDENT_AMBULATORY_CARE_PROVIDER_SITE_OTHER): Payer: BC Managed Care – PPO | Admitting: Surgical

## 2021-11-30 DIAGNOSIS — D3502 Benign neoplasm of left adrenal gland: Secondary | ICD-10-CM

## 2021-11-30 DIAGNOSIS — M1711 Unilateral primary osteoarthritis, right knee: Secondary | ICD-10-CM

## 2021-11-30 NOTE — Progress Notes (Signed)
Office Visit Note   Patient: Ann Morgan           Date of Birth: 13-Mar-1971           MRN: [id number] Visit Date: 11/30/2021 Requested by: Bo Merino, [address] [place],  [place] [id number] PCP: Bo Merino, FNP  Subjective: Chief Complaint  Patient presents with   Other    Scan review    HPI: Ann Morgan is a 51 y.o. female who presents to the office for MRI review.  Patient was last seen about 6 months ago and had about 40 cc of fluid aspirated from the right knee with injection.  Notes improvement from the injection and her pain is not as bad as it was at her last appointment.  She describes anterior pain without any low back pain, groin pain, radicular pain.  She has occasional numbness or tingling in the lateral thigh at times but this does not seem related to the onset of her knee pain.  She has difficulty kneeling on the knee with severe pain after about 5 minutes of kneeling.  She notes stiffness with immobility in the morning and with long periods of driving.  She had occasional catching in the right knee in the past but she has not had any in the last several months.  Decreased range of motion in regards to the flexion of the right knee relative to the left.  No history of prior surgery to the right knee.  Knee does not give out on her.  She has continued to work out and has been able to get back to more of a workout but she has been doing some modifications that make exercises not as taxing on her knee.  MRI results revealed: MR Knee Right w/o contrast  Result Date: 11/24/2021 CLINICAL DATA:  Primary osteoarthritis of the right knee. Evaluate for source of knee effusion and knee pain. Mostly anterior pain. Fluid drawn off right knee a couple of months ago. EXAM: MRI OF THE RIGHT KNEE WITHOUT CONTRAST TECHNIQUE: Multiplanar, multisequence MR imaging of the knee was performed. No intravenous contrast was administered. COMPARISON:  Right  knee radiographs 06/07/2021 FINDINGS: MENISCI Medial meniscus: There is a small oblique undersurface tear within the peripheral third of the meniscal triangle of the junction of the body and posterior horn of the medial meniscus (sagittal series 7, image 26 and coronal series 6, image 16). Lateral meniscus: There is truncation of the central third of meniscal triangle of the posterior horn of the lateral meniscus (sagittal images 11 and 12), a focal radial tear. There is degenerative fraying of the free edge of the mid AP dimension of the body of the lateral meniscus (coronal series 6 images 17 through 19). LIGAMENTS Cruciates: The ACL and PCL are intact. Collaterals: The medial collateral ligament is intact. The fibular collateral ligament, biceps femoris tendon, iliotibial band, and popliteus tendon are intact. CARTILAGE Patellofemoral: Full-thickness cartilage loss throughout the majority of the superior 40% of the lateral patellar facet with moderate diffuse subchondral marrow edema and cystic change. Full-thickness cartilage loss within the superior aspect of the lateral trochlea. Medial: Mild-to-moderate thinning of the lateral aspect of the weight-bearing medial femoral condyle cartilage. Lateral: There is multifocal full-thickness cartilage loss within the posteromedial aspect of the lateral tibial plateau with mild subchondral marrow edema. Mild-to-moderate thinning of the adjacent posterolateral femoral condyle cartilage. Joint: Smalljoint effusion. Normal Hoffa's fat pad. No plical thickening. Popliteal Fossa:  No Baker's  cyst. Extensor Mechanism:  Intact quadriceps tendon and patellar tendon. Bones: Mild-to-moderate peripheral medial compartment and mild peripheral glenoid degenerative osteophytes. Moderate-to-large superior and moderate lateral patellar degenerative osteophytes. Other: None. IMPRESSION: 1. Small oblique undersurface tear within the junction of the body and posterior horn of the medial  meniscus. 2. Focal radial tear within the free edge of the posterior horn of the lateral meniscus. Additional degenerative fraying of the free edge of the body of the lateral meniscus. 3. Severe patellofemoral and mild-to-moderate medial and lateral compartment cartilage degenerative changes. 4. Small joint effusion. Electronically Signed   By: Yvonne Kendall M.D.   On: 11/24/2021 08:58                 ROS: All systems reviewed are negative as they relate to the chief complaint within the history of present illness.  Patient denies fevers or chills.  Assessment & Plan: Visit Diagnoses:  1. Primary osteoarthritis of right knee     Plan: Ann Morgan is a 51 y.o. female who presents to the office for review of MRI of the right knee.  MRI demonstrates several findings with small undersurface tear of the medial meniscus as well as focal radial tear and degenerative fraying of the free edge of the lateral meniscus.  Also notes some mild to moderate thinning of the medial and lateral compartment cartilage with moderate cartilage loss throughout the distal femoral compartment primarily on the patellar side with severe thinning of the lateral patellar facet cartilage and associated subchondral cystic changes and marrow edema.  Based on her symptoms which are primarily anterior pain that gives her difficulty with kneeling and stiffness with driving for long periods of time without any mechanical symptoms, suspect that the arthritis in the patellofemoral compartment is the main driver of her pain.  She has absolutely no tenderness over the medial or lateral joint lines of the right knee when she is flexed at 90 degrees and hyperflexed to about 110 degrees.  She also has decreased passive flexion of the right knee relative to the left consistent with arthritis.  Had discussion of options with Ann Morgan today.  Discussed the natural history of arthritis and recommended against arthroscopic intervention at this  time given her lack of any symptoms related to the meniscal pathology definitively and with the already present chondral degeneration throughout the knee.  She will focus on continue with her workout classes with activity modification and avoiding heavy loadbearing exercises on the knee.  She is welcome to return to the office at any point for repeat cortisone injection or to try gel injections in the future but for now she will just continue with her exercises.  Hold off on any surgical intervention such as knee arthroplasty until her symptoms are much more severe.  Overall she feels better than she did earlier this year when she was last seen.  Follow-Up Instructions: No follow-ups on file.   Orders:  No orders of the defined types were placed in this encounter.  No orders of the defined types were placed in this encounter.     Procedures: No procedures performed   Clinical Data: No additional findings.  Objective: Vital Signs: There were no vitals taken for this visit.  Physical Exam:  Constitutional: Patient appears well-developed HEENT:  Head: Normocephalic Eyes:EOM are normal Neck: Normal range of motion Cardiovascular: Normal rate Pulmonary/chest: Effort normal Neurologic: Patient is alert Skin: Skin is warm Psychiatric: Patient has normal mood and affect  Ortho Exam: Ortho exam  demonstrates right knee with no effusion.  No calf tenderness.  Negative Homans' sign.  Really no tenderness at all over the medial or lateral joint lines.  She does have patellofemoral crepitus.  She is able to perform straight leg raise with 0 degrees extension and excellent quad strength rated 5/5.  She flexes to 110 degrees in the right knee and 120 degrees in the left knee.  No pain with hip range of motion.  Stable to anterior and posterior drawer sign.  Specialty Comments:  No specialty comments available.  Imaging: No results found.   PMFS History: Patient Active Problem List    Diagnosis Date Noted   Adrenal nodule ([place]) 11/09/2021   Abdominal pain, left lower quadrant 08/31/2021   Left ovarian cyst 08/31/2021   Ovary, ovarian pedicle and fallopian tube torsion 08/31/2021   Abdominal pain 01/02/5101   Complications affecting other specified body systems, hypertension 09/02/2020   Muscle weakness 09/02/2020   Traumatic complete tear of right rotator cuff 04/10/2019   Reactive airway disease without complication 58/52/7782   Epidermal cyst of neck 02/17/2018   Skin abnormalities 07/15/2017   Endometrial hyperplasia without atypia, simple 05/17/2017   Allergic rhinitis 03/07/2017   Patellofemoral pain syndrome of both knees 02/04/2017   Vasovagal syncope 02/04/2017   Glucose intolerance 01/01/2017   Overactive bladder 01/01/2017   Body mass index (BMI) of 45.0-49.9 in adult [facility]) 09/19/2015   Injury of left wrist 05/09/2015   Screening for STD (sexually transmitted disease) 04/06/2015   Varicose veins of both lower extremities with pain 10/27/2014   Mild intermittent asthma with acute exacerbation 10/19/2013   Breast cyst 12/11/2012   Abnormal mammogram 06/09/2012   Premenopausal menorrhagia 04/28/2012   Polycystic ovaries 03/29/2010   Class 3 severe obesity due to excess calories in adult [facility]) 03/29/2010   Essential hypertension 03/29/2010   Past Medical History:  Diagnosis Date   Abnormal Pap smear of cervix    Asthma    Endometrial hyperplasia    Hypertension 2004   Irregular menses    Mild intermittent asthma with acute exacerbation 10/19/2013   Late 2013 with a cough that lasted approximately one month.  PFTs showed FEV1 of 83%  Last Assessment & Plan:  Reports that since she was prescribed albuterol, she has used it maybe 1x to see how she would respond to it which seemed to help. However, she still has some coughing issues but thinks it is manageable. No issues with high pollen season.   Plan: - Continue with albuterol PRN.  - She will   Obesity      Family History  Problem Relation Age of Onset   Hypertension Mother    Diabetes Brother    Hypertension Brother    Breast cancer Maternal Grandmother 70    Past Surgical History:  Procedure Laterality Date   BREAST CYST ASPIRATION Left 2015   benign   CYSTOSCOPY  08/31/2021   Procedure: CYSTOSCOPY;  Surgeon: Will Bonnet, MD;  Location: [facility];  Service: Gynecology;;   KNEE SURGERY Left jan 2011   orthoscopic   ROBOTIC ASSISTED LAPAROSCOPIC LYSIS OF ADHESION  08/31/2021   Procedure: XI ROBOTIC ASSISTED LAPAROSCOPIC LYSIS OF ADHESION;  Surgeon: Will Bonnet, MD;  Location: [facility];  Service: Gynecology;;   ROBOTIC ASSISTED LAPAROSCOPIC OVARIAN CYSTECTOMY Left 08/31/2021   Procedure: XI ROBOTIC ASSISTED LAPAROSCOPIC LEFT SALPINGO OOPHORECTOMY CYSTECTOMY;  Surgeon: Will Bonnet, MD;  Location: [facility];  Service: Gynecology;  Laterality: Left;   SHOULDER  ARTHROSCOPY WITH SUBACROMIAL DECOMPRESSION, ROTATOR CUFF REPAIR AND BICEP TENDON REPAIR Right 12/25/2018   Procedure: right shoulder arthroscopy, biceps tendon release with tenodesis, three tendon rotator cuff repair;  Surgeon: Meredith Pel, MD;  Location: [facility];  Service: Orthopedics;  Laterality: Right;   Social History   Occupational History   Not on file  Tobacco Use   Smoking status: Never   Smokeless tobacco: Never  Vaping Use   Vaping Use: Never used  Substance and Sexual Activity   Alcohol use: No   Drug use: No   Sexual activity: Yes    Partners: Male    Birth control/protection: None

## 2021-11-30 NOTE — Progress Notes (Unsigned)
Start date:11/29/21 4:55am End date:11/30/21 4:44am Total volume=1,700

## 2021-12-02 LAB — ALDOSTERONE + RENIN ACTIVITY W/ RATIO
ALDOS/RENIN RATIO: 67.5 — ABNORMAL HIGH (ref 0.0–30.0)
ALDOSTERONE: 13.3 ng/dL (ref 0.0–30.0)
Renin: 0.197 ng/mL/hr (ref 0.167–5.380)

## 2021-12-07 LAB — CORTISOL, URINE, 24 HOUR
24 Hour urine volume (VMAHVA): 1700 mL
CREATININE, URINE: 2.22 g/(24.h) — ABNORMAL HIGH (ref 0.50–2.15)
Cortisol (Ur), Free: 29.1 mcg/24 h (ref 4.0–50.0)

## 2021-12-07 LAB — CATECHOLAMINES, FRACTIONATED, URINE, 24 HOUR
Calc Total (E+NE): 60 mcg/24 h (ref 26–121)
Creatinine, Urine mg/day-CATEUR: 2.29 g/(24.h) — ABNORMAL HIGH (ref 0.50–2.15)
Dopamine 24 Hr Urine: 471 mcg/24 h (ref 52–480)
Norepinephrine, 24H, Ur: 60 mcg/24 h (ref 15–100)
Total Volume: 1700 mL

## 2021-12-08 ENCOUNTER — Other Ambulatory Visit: Payer: Self-pay

## 2021-12-10 ENCOUNTER — Encounter: Payer: Self-pay | Admitting: Internal Medicine

## 2021-12-15 ENCOUNTER — Telehealth: Payer: Self-pay

## 2021-12-15 NOTE — Telephone Encounter (Signed)
You stated in your previous message that Aldosterone and the renin are normal, the ratio is a bit elevated but the most important test is aldosterone and the renin as the ratio can be variable depending on the testing. Your blood pressure was also normal during your office visit   At this time you recommend rechecking these hormones and if aldosterone or the renin become abnormal then we will have to proceed with further testing.     Should I schedule an appointment with you again or just go to the lab

## 2021-12-21 ENCOUNTER — Other Ambulatory Visit: Payer: Self-pay | Admitting: Nurse Practitioner

## 2021-12-21 DIAGNOSIS — I10 Essential (primary) hypertension: Secondary | ICD-10-CM

## 2021-12-22 ENCOUNTER — Ambulatory Visit: Payer: BC Managed Care – PPO | Admitting: Nurse Practitioner

## 2021-12-22 NOTE — Telephone Encounter (Signed)
Requested Prescriptions  Pending Prescriptions Disp Refills  . triamterene-hydrochlorothiazide (DYAZIDE) 37.5-25 MG capsule [Pharmacy Med Name: TRIAMTERENE 37.'5MG'$ / HCTZ '25MG'$  CAPS] 90 capsule 3    Sig: TAKE 1 CAPSULE BY MOUTH EVERY MORNING     Cardiovascular: Diuretic Combos Passed - 12/21/2021  5:14 PM      Passed - K in normal range and within 180 days    Potassium  Date Value Ref Range Status  11/28/2021 3.6 3.5 - 5.1 mEq/L Final         Passed - Na in normal range and within 180 days    Sodium  Date Value Ref Range Status  11/28/2021 138 135 - 145 mEq/L Final         Passed - Cr in normal range and within 180 days    Creat  Date Value Ref Range Status  08/23/2021 0.96 0.50 - 0.99 mg/dL Final   Creatinine, Ser  Date Value Ref Range Status  11/28/2021 0.88 0.40 - 1.20 mg/dL Final         Passed - Last BP in normal range    BP Readings from Last 1 Encounters:  11/28/21 122/80         Passed - Valid encounter within last 6 months    Recent Outpatient Visits          1 month ago Adrenal nodule [facility])   [facility] Bo Merino, FNP   3 months ago Left lower quadrant abdominal pain   [facility] Bo Merino, FNP   4 months ago Left lower quadrant abdominal pain   [place], DO   6 months ago Varicose veins of both lower extremities with pain   [facility] Bo Merino, FNP   12 months ago Essential hypertension   [facility] Bo Merino, FNP      Future Appointments            In 4 months Reece Packer, Myna Hidalgo, [facility], [facility]

## 2022-01-16 ENCOUNTER — Other Ambulatory Visit: Payer: Self-pay | Admitting: Obstetrics and Gynecology

## 2022-01-16 DIAGNOSIS — Z1231 Encounter for screening mammogram for malignant neoplasm of breast: Secondary | ICD-10-CM

## 2022-03-15 ENCOUNTER — Ambulatory Visit (LOCAL_COMMUNITY_HEALTH_CENTER): Payer: BC Managed Care – PPO

## 2022-03-15 DIAGNOSIS — Z23 Encounter for immunization: Secondary | ICD-10-CM | POA: Diagnosis not present

## 2022-03-15 DIAGNOSIS — Z719 Counseling, unspecified: Secondary | ICD-10-CM

## 2022-03-15 NOTE — Progress Notes (Signed)
  Are you feeling sick today? No   Have you ever received a dose of COVID-19 Vaccine? [employer], [place], [place], [place], Other) No  If yes, which vaccine and how many doses?   Yes 3 doses Pfizer   Did you bring the vaccination record card or other documentation?  No   Do you have a health condition or are undergoing treatment that makes you moderately or severely immunocompromised? This would include, but not be limited to: cancer, HIV, organ transplant, immunosuppressive therapy/high-dose corticosteroids, or moderate/severe primary immunodeficiency.  No  Have you received COVID-19 vaccine before or during hematopoietic cell transplant (HCT) or CAR-T-cell therapies? No  Have you ever had an allergic reaction to: (This would include a severe allergic reaction or a reaction that caused hives, swelling, or respiratory distress, including wheezing.) A component of a COVID-19 vaccine or a previous dose of COVID-19 vaccine? No   Have you ever had an allergic reaction to another vaccine (other thanCOVID-19 vaccine) or an injectable medication? (This would include a severe allergic reaction or a reaction that caused hives, swelling, or respiratory distress, including wheezing.)   No    Do you have a history of any of the following:  Myocarditis or Pericarditis No  Dermal fillers:  No  Multisystem Inflammatory Syndrome (MIS-C or MIS-A)? No  COVID-19 disease within the past 3 months? No  Vaccinated with monkeypox vaccine in the last 4 weeks? No  Comirnaty +12Y 2023-24 IM left deltoid.  Tolerated well. VIS provided. [facility] updated and copy provided.  Waited 15 minutes.

## 2022-03-20 ENCOUNTER — Ambulatory Visit (INDEPENDENT_AMBULATORY_CARE_PROVIDER_SITE_OTHER): Payer: BC Managed Care – PPO | Admitting: Orthopedic Surgery

## 2022-03-20 DIAGNOSIS — M1711 Unilateral primary osteoarthritis, right knee: Secondary | ICD-10-CM | POA: Diagnosis not present

## 2022-03-21 ENCOUNTER — Encounter: Payer: Self-pay | Admitting: Orthopedic Surgery

## 2022-03-21 NOTE — Progress Notes (Signed)
Office Visit Note   Patient: Ann Morgan           Date of Birth: Dec 10, 1970           MRN: [id number] Visit Date: 03/20/2022 Requested by: Bo Merino, [place],  [place] [address] PCP: Bo Merino, FNP  Subjective: Chief Complaint  Patient presents with   Right Knee - Pain    HPI: Ann Morgan is a 52 y.o. female who presents to the office reporting right knee pain.  Pain been going on for a week.  Denies any history of injury.  Does report some swelling and stiffness.  History of left knee surgery in 2001.  MRI scan in 923 of the right knee shows severe patellofemoral arthritis.  Ann Morgan feels like she has some swelling again.  Hard for her to drive a long time.  Occasional giving way but no buckling..                ROS: All systems reviewed are negative as they relate to the chief complaint within the history of present illness.  Patient denies fevers or chills.  Assessment & Plan: Visit Diagnoses:  1. Primary osteoarthritis of right knee     Plan: Impression is right knee pain with patellofemoral arthritis.  Aspiration and injection not indicated today.  No effusion.  Continue with quad strengthening exercises as well as optimization of body mass index.  She may need surgical intervention at sometime in the future but for now she is clinically stable enough to continue with her current ADLs.  She will follow-up with [place] as needed.  Follow-Up Instructions: No follow-ups on file.   Orders:  No orders of the defined types were placed in this encounter.  No orders of the defined types were placed in this encounter.     Procedures: No procedures performed   Clinical Data: No additional findings.  Objective: Vital Signs: LMP 11/17/2021   Physical Exam:  Constitutional: Patient appears well-developed HEENT:  Head: Normocephalic Eyes:EOM are normal Neck: Normal range of motion Cardiovascular: Normal  rate Pulmonary/chest: Effort normal Neurologic: Patient is alert Skin: Skin is warm Psychiatric: Patient has normal mood and affect  Ortho Exam: Ortho exam demonstrates excellent range of motion of the right knee but patellofemoral crepitus is more prevalent on the right compared to the left.  Collateral crucial ligaments are stable.  No groin pain with internal/external rotation of the leg.  Ankle dorsiflexion intact with palpable pedal pulses.  Specialty Comments:  No specialty comments available.  Imaging: No results found.   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/09/2021   Abdominal pain, left lower quadrant 08/31/2021   Left ovarian cyst 08/31/2021   Ovary, ovarian pedicle and fallopian tube torsion 08/31/2021   Abdominal pain 41/32/4401   Complications affecting other specified body systems, hypertension 09/02/2020   Muscle weakness 09/02/2020   Traumatic complete tear of right rotator cuff 04/10/2019   Reactive airway disease without complication 02/72/5366   Epidermal cyst of neck 02/17/2018   Skin abnormalities 07/15/2017   Endometrial hyperplasia without atypia, simple 05/17/2017   Allergic rhinitis 03/07/2017   Patellofemoral pain syndrome of both knees 02/04/2017   Vasovagal syncope 02/04/2017   Glucose intolerance 01/01/2017   Overactive bladder 01/01/2017   Body mass index (BMI) of 45.0-49.9 in adult [facility]) 09/19/2015   Injury of left wrist 05/09/2015   Screening for STD (  sexually transmitted disease) 04/06/2015   Varicose veins of both lower extremities with pain 10/27/2014   Mild intermittent asthma with acute exacerbation 10/19/2013   Breast cyst 12/11/2012   Abnormal mammogram 06/09/2012   Premenopausal menorrhagia 04/28/2012   Polycystic ovaries 03/29/2010   Class 3 severe obesity due to excess calories in adult [facility]) 03/29/2010   Essential hypertension 03/29/2010   Past Medical History:  Diagnosis  Date   Abnormal Pap smear of cervix    Asthma    Endometrial hyperplasia    Hypertension 2004   Irregular menses    Mild intermittent asthma with acute exacerbation 10/19/2013   Late 2013 with a cough that lasted approximately one month.  PFTs showed FEV1 of 83%  Last Assessment & Plan:  Reports that since she was prescribed albuterol, she has used it maybe 1x to see how she would respond to it which seemed to help. However, she still has some coughing issues but thinks it is manageable. No issues with high pollen season.   Plan: - Continue with albuterol PRN.  - She will   Obesity     Family History  Problem Relation Age of Onset   Hypertension Mother    Diabetes Brother    Hypertension Brother    Breast cancer Maternal Grandmother 62    Past Surgical History:  Procedure Laterality Date   BREAST CYST ASPIRATION Left 2015   benign   CYSTOSCOPY  08/31/2021   Procedure: CYSTOSCOPY;  Surgeon: Will Bonnet, MD;  Location: [facility] ORS;  Service: Gynecology;;   KNEE SURGERY Left jan 2011   orthoscopic   ROBOTIC ASSISTED LAPAROSCOPIC LYSIS OF ADHESION  08/31/2021   Procedure: XI ROBOTIC ASSISTED LAPAROSCOPIC LYSIS OF ADHESION;  Surgeon: Will Bonnet, MD;  Location: [facility] OR[facility];  Service: Gynecology;;   ROBOTIC ASSISTED LAPAROSCOPIC OVARIAN CYSTECTOMY Left 08/31/2021   Procedure: XI ROBOTIC ASSISTED LAPAROSCOPIC LEFT SALPINGO OOPHORECTOMY CYSTECTOMY;  Surgeon: Will Bonnet, MD;  Location: [facility] ORS;  Service: Gynecology;  Laterality: Left;   SHOULDER ARTHROSCOPY WITH SUBACROMIAL DECOMPRESSION, ROTATOR CUFF REPAIR AND BICEP TENDON REPAIR Right 12/25/2018   Procedure: right shoulder arthroscopy, biceps tendon release with tenodesis, three tendon rotator cuff repair;  Surgeon: Meredith Pel, MD;  Location: [place];  Service: Orthopedics;  Laterality: Right;   Social History   Occupational History   Not on file  Tobacco Use   Smoking status: Never   Smokeless tobacco: Never   Vaping Use   Vaping Use: Never used  Substance and Sexual Activity   Alcohol use: No   Drug use: No   Sexual activity: Yes    Partners: Male    Birth control/protection: None

## 2022-03-31 ENCOUNTER — Other Ambulatory Visit: Payer: Self-pay | Admitting: Nurse Practitioner

## 2022-03-31 DIAGNOSIS — I10 Essential (primary) hypertension: Secondary | ICD-10-CM

## 2022-04-02 NOTE — Telephone Encounter (Signed)
Requested Prescriptions  Pending Prescriptions Disp Refills   triamterene-hydrochlorothiazide (DYAZIDE) 37.5-25 MG capsule [Pharmacy Med Name: TRIAMTERENE 37.'5MG'$ / HCTZ '25MG'$  CAPS] 90 capsule 0    Sig: TAKE 1 CAPSULE BY MOUTH EVERY MORNING     Cardiovascular: Diuretic Combos Passed - 03/31/2022 11:06 AM      Passed - K in normal range and within 180 days    Potassium  Date Value Ref Range Status  11/28/2021 3.6 3.5 - 5.1 mEq/L Final         Passed - Na in normal range and within 180 days    Sodium  Date Value Ref Range Status  11/28/2021 138 135 - 145 mEq/L Final         Passed - Cr in normal range and within 180 days    Creat  Date Value Ref Range Status  08/23/2021 0.96 0.50 - 0.99 mg/dL Final   Creatinine, Ser  Date Value Ref Range Status  11/28/2021 0.88 0.40 - 1.20 mg/dL Final         Passed - Last BP in normal range    BP Readings from Last 1 Encounters:  11/28/21 122/80         Passed - Valid encounter within last 6 months    Recent Outpatient Visits           4 months ago Adrenal nodule [facility])   [facility] Bo Merino, FNP   7 months ago Left lower quadrant abdominal pain   [facility] Bo Merino, FNP   7 months ago Left lower quadrant abdominal pain   [facility] Per[facility] M, DO   9 months ago Varicose veins of both lower extremities with pain   [facility] Bo Merino, FNP   1 year ago Essential hypertension   [facility] Bo Merino, FNP       Future Appointments             In 1 month Reece Packer, Myna Hidalgo, [facility], [facility]

## 2022-04-11 ENCOUNTER — Ambulatory Visit (INDEPENDENT_AMBULATORY_CARE_PROVIDER_SITE_OTHER): Payer: BC Managed Care – PPO

## 2022-04-11 ENCOUNTER — Ambulatory Visit (INDEPENDENT_AMBULATORY_CARE_PROVIDER_SITE_OTHER): Payer: BC Managed Care – PPO | Admitting: Orthopedic Surgery

## 2022-04-11 ENCOUNTER — Encounter: Payer: Self-pay | Admitting: Orthopedic Surgery

## 2022-04-11 DIAGNOSIS — M25531 Pain in right wrist: Secondary | ICD-10-CM | POA: Diagnosis not present

## 2022-04-11 DIAGNOSIS — M1711 Unilateral primary osteoarthritis, right knee: Secondary | ICD-10-CM

## 2022-04-11 NOTE — Progress Notes (Signed)
Office Visit Note   Patient: Ann Morgan           Date of Birth: Jul 11, 1970           MRN: [id number] Visit Date: 04/11/2022 Requested by: Bo Merino, [place] [place],  [place] [address] PCP: Bo Merino, FNP  Subjective: Chief Complaint  Patient presents with   Right Hand - Pain   Right Wrist - Pain   Left Knee - Pain    HPI: Ann Morgan is a 52 y.o. female who presents to the office reporting right wrist pain and right knee pain.  She would like an injection in the right knee.  She is going out of the country for 2 weeks to [place] for her 52th birthday.  This will be a fashion show type of birthday.  Would like to try an injection.  Hard for her to walk at times.  Also reports 2-week history of right wrist and hand pain.  She was lifting heavy equipment and noticed some swelling on the dorsal ulnar aspect of her metacarpal region.  When she twists and holds the phone it is painful..                ROS: All systems reviewed are negative as they relate to the chief complaint within the history of present illness.  Patient denies fevers or chills.  Assessment & Plan: Visit Diagnoses:  1. Pain in right wrist     Plan: Impression is right knee patellofemoral arthritis.  Aspiration injection performed today.  Not too much fluid in the knee.  The right hand and wrist is unremarkable in terms of radiographic and physical exam findings.  Slight swelling around the metacarpal but no real tendinitis type symptoms or exam findings.  I think that something we can watch for now.  We will see how she does with her knee for her upcoming birthday celebration.  Follow-up as needed.  Follow-Up Instructions: No follow-ups on file.   Orders:  Orders Placed This Encounter  Procedures   XR Hand Complete Right   No orders of the defined types were placed in this encounter.     Procedures: Large Joint Inj: R knee on 04/11/2022 12:38  PM Indications: diagnostic evaluation, joint swelling and pain Details: 18 G 1.5 in needle, superolateral approach  Arthrogram: No  Medications: 5 mL lidocaine 1 %; 40 mg methylPREDNISolone acetate 40 MG/ML; 4 mL bupivacaine 0.25 % Outcome: tolerated well, no immediate complications Procedure, treatment alternatives, risks and benefits explained, specific risks discussed. Consent was given by the patient. Immediately prior to procedure a time out was called to verify the correct patient, procedure, equipment, support staff and site/side marked as required. Patient was prepped and draped in the usual sterile fashion.       Clinical Data: No additional findings.  Objective: Vital Signs: LMP 11/17/2021   Physical Exam:  Constitutional: Patient appears well-developed HEENT:  Head: Normocephalic Eyes:EOM are normal Neck: Normal range of motion Cardiovascular: Normal rate Pulmonary/chest: Effort normal Neurologic: Patient is alert Skin: Skin is warm Psychiatric: Patient has normal mood and affect  Ortho Exam: Ortho exam demonstrates pretty reasonable grip strength bilaterally.  Flexion extension radial ulnar deviation intact bilateral bilateral wrist.  Radial pulse intact.  No focal tenderness to palpation to the metacarpals on the right-hand side.  Right knee is examined.  Range of motion intact.  Patellofemoral crepitus is present with trace effusion.  Collateral crucial ligaments  are stable.  Flexion easily past 90.  Specialty Comments:  No specialty comments available.  Imaging: XR Hand Complete Right  Result Date: 04/11/2022 AP lateral oblique radiographs right hand reviewed.  No acute fracture.  No significant CMC joint arthritis.  Radiocarpal alignment intact.  No erosions.    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/09/2021   Abdominal pain, left lower quadrant 08/31/2021   Left ovarian cyst 08/31/2021   Ovary,  ovarian pedicle and fallopian tube torsion 08/31/2021   Abdominal pain 20/12/710   Complications affecting other specified body systems, hypertension 09/02/2020   Muscle weakness 09/02/2020   Traumatic complete tear of right rotator cuff 04/10/2019   Reactive airway disease without complication 19/75/8832   Epidermal cyst of neck 02/17/2018   Skin abnormalities 07/15/2017   Endometrial hyperplasia without atypia, simple 05/17/2017   Allergic rhinitis 03/07/2017   Patellofemoral pain syndrome of both knees 02/04/2017   Vasovagal syncope 02/04/2017   Glucose intolerance 01/01/2017   Overactive bladder 01/01/2017   Body mass index (BMI) of 45.0-49.9 in adult [facility]) 09/19/2015   Injury of left wrist 05/09/2015   Screening for STD (sexually transmitted disease) 04/06/2015   Varicose veins of both lower extremities with pain 10/27/2014   Mild intermittent asthma with acute exacerbation 10/19/2013   Breast cyst 12/11/2012   Abnormal mammogram 06/09/2012   Premenopausal menorrhagia 04/28/2012   Polycystic ovaries 03/29/2010   Class 3 severe obesity due to excess calories in adult [facility]) 03/29/2010   Essential hypertension 03/29/2010   Past Medical History:  Diagnosis Date   Abnormal Pap smear of cervix    Asthma    Endometrial hyperplasia    Hypertension 2004   Irregular menses    Mild intermittent asthma with acute exacerbation 10/19/2013   Late 2013 with a cough that lasted approximately one month.  PFTs showed FEV1 of 83%  Last Assessment & Plan:  Reports that since she was prescribed albuterol, she has used it maybe 1x to see how she would respond to it which seemed to help. However, she still has some coughing issues but thinks it is manageable. No issues with high pollen season.   Plan: - Continue with albuterol PRN.  - She will   Obesity     Family History  Problem Relation Age of Onset   Hypertension Mother    Diabetes Brother    Hypertension Brother    Breast cancer Maternal  Grandmother 58    Past Surgical History:  Procedure Laterality Date   BREAST CYST ASPIRATION Left 2015   benign   CYSTOSCOPY  08/31/2021   Procedure: CYSTOSCOPY;  Surgeon: Will Bonnet, MD;  Location: [facility];  Service: Gynecology;;   KNEE SURGERY Left jan 2011   orthoscopic   ROBOTIC ASSISTED LAPAROSCOPIC LYSIS OF ADHESION  08/31/2021   Procedure: XI ROBOTIC ASSISTED LAPAROSCOPIC LYSIS OF ADHESION;  Surgeon: Will Bonnet, MD;  Location: [facility];  Service: Gynecology;;   ROBOTIC ASSISTED LAPAROSCOPIC OVARIAN CYSTECTOMY Left 08/31/2021   Procedure: XI ROBOTIC ASSISTED LAPAROSCOPIC LEFT SALPINGO OOPHORECTOMY CYSTECTOMY;  Surgeon: Will Bonnet, MD;  Location: [facility];  Service: Gynecology;  Laterality: Left;   SHOULDER ARTHROSCOPY WITH SUBACROMIAL DECOMPRESSION, ROTATOR CUFF REPAIR AND BICEP TENDON REPAIR Right 12/25/2018   Procedure: right shoulder arthroscopy, biceps tendon release with tenodesis, three tendon rotator cuff repair;  Surgeon: Meredith Pel, MD;  Location: [place];  Service: Orthopedics;  Laterality: Right;   Social  History   Occupational History   Not on file  Tobacco Use   Smoking status: Never   Smokeless tobacco: Never  Vaping Use   Vaping Use: Never used  Substance and Sexual Activity   Alcohol use: No   Drug use: No   Sexual activity: Yes    Partners: Male    Birth control/protection: None

## 2022-04-14 MED ORDER — BUPIVACAINE HCL 0.25 % IJ SOLN
4.0000 mL | INTRAMUSCULAR | Status: AC | PRN
  Administered 2022-04-11: 4 mL via INTRA_ARTICULAR

## 2022-04-14 MED ORDER — LIDOCAINE HCL 1 % IJ SOLN
5.0000 mL | INTRAMUSCULAR | Status: AC | PRN
  Administered 2022-04-11: 5 mL

## 2022-04-14 MED ORDER — METHYLPREDNISOLONE ACETATE 40 MG/ML IJ SUSP
40.0000 mg | INTRAMUSCULAR | Status: AC | PRN
  Administered 2022-04-11: 40 mg via INTRA_ARTICULAR

## 2022-04-17 ENCOUNTER — Ambulatory Visit: Payer: BC Managed Care – PPO | Admitting: Internal Medicine

## 2022-04-20 ENCOUNTER — Ambulatory Visit
Admission: RE | Admit: 2022-04-20 | Discharge: 2022-04-20 | Disposition: A | Discharge status: Home / Self Care | Payer: BC Managed Care – PPO | Source: Ambulatory Visit | Attending: Obstetrics and Gynecology | Admitting: Obstetrics and Gynecology

## 2022-04-20 DIAGNOSIS — Z1231 Encounter for screening mammogram for malignant neoplasm of breast: Secondary | ICD-10-CM | POA: Insufficient documentation

## 2022-05-09 DIAGNOSIS — Z124 Encounter for screening for malignant neoplasm of cervix: Secondary | ICD-10-CM | POA: Diagnosis not present

## 2022-05-09 DIAGNOSIS — Z01411 Encounter for gynecological examination (general) (routine) with abnormal findings: Secondary | ICD-10-CM | POA: Diagnosis not present

## 2022-05-09 DIAGNOSIS — N8501 Benign endometrial hyperplasia: Secondary | ICD-10-CM | POA: Diagnosis not present

## 2022-05-09 DIAGNOSIS — Z1339 Encounter for screening examination for other mental health and behavioral disorders: Secondary | ICD-10-CM | POA: Diagnosis not present

## 2022-05-09 DIAGNOSIS — Z1331 Encounter for screening for depression: Secondary | ICD-10-CM | POA: Diagnosis not present

## 2022-05-10 NOTE — Progress Notes (Signed)
BP 132/84   Pulse 74   Temp 98.6 F (37 C) (Oral)   Resp 16   Ht '5\' 9"'$  (1.753 m)   Wt 297 lb 9.6 oz (135 kg)   SpO2 96%   BMI 43.95 kg/m    Subjective:    Patient ID: Ann Morgan, female    DOB: Aug 02, 1970, 52 y.o.   MRN: [id number]  HPI: Ann Morgan is a 52 y.o. female  Chief Complaint  Patient presents with   Hypertension    Follow up   HTN: her blood pressure today is 132/84. She currently takes triamterene-hydrochlorothiazide  37.5 mg-25 mg daily.  Denies any chest pain, shortness of breath, headaches or blurred vision. Will continue with current treatment. Recommend continued lifestyle modification.   Adrenal nodule:  incidental finding on ct scan showed adrenal nodule. Patient is now established with endocrinology and is following this.  She will go back in September for repeat labs.   Obesity: her weight today is 297 lbs with a BMI of 43.95. she says that she has been having right knee pain. She says she has had to scale back on her exercise. She says she has been not eating the best lately as well.  Discussed weight loss medication, she would like to see if she can get approved for wegovy. She denies any family history of thyroid cancer or personal history of pancreatitis. Discussed side effects.   Relevant past medical, surgical, family and social history reviewed and updated as indicated. Interim medical history since our last visit reviewed. Allergies and medications reviewed and updated.  Review of Systems  Constitutional: Negative for fever or weight change.  Respiratory: Negative for cough and shortness of breath.   Cardiovascular: Negative for chest pain or palpitations.  Gastrointestinal: Negative for abdominal pain, no bowel changes.  Musculoskeletal: Negative for gait problem or joint swelling.  Skin: Negative for rash.  Neurological: Negative for dizziness or headache.  No other specific complaints in a complete review of systems (except  as listed in HPI above).      Objective:    BP 132/84   Pulse 74   Temp 98.6 F (37 C) (Oral)   Resp 16   Ht '5\' 9"'$  (1.753 m)   Wt 297 lb 9.6 oz (135 kg)   SpO2 96%   BMI 43.95 kg/m   Wt Readings from Last 3 Encounters:  05/11/22 297 lb 9.6 oz (135 kg)  11/28/21 285 lb 6.4 oz (129.5 kg)  11/09/21 286 lb 11.2 oz (130 kg)    Physical Exam  Constitutional: Patient appears well-developed and well-nourished. Obese  No distress.  HEENT: head atraumatic, normocephalic, pupils equal and reactive to light, neck supple, Cardiovascular: Normal rate, regular rhythm and normal heart sounds.  No murmur heard. No BLE edema. Pulmonary/Chest: Effort normal and breath sounds normal. No respiratory distress. Abdominal: Soft.  There is no tenderness. Psychiatric: Patient has a normal mood and affect. behavior is normal. Judgment and thought content normal.  Results for orders placed or performed in visit on 11/30/21  Catecholamines, fractionated, Urine, 24 hour  Result Value Ref Range   Total Volume 1,700 mL   Epinephrine, 24H, Ur see note mcg/24 h   Norepinephrine, 24H, Ur 60 15 - 100 mcg/24 h   Calc Total (E+NE) 60 26 - 121 mcg/24 h   Dopamine 24 Hr Urine 471 52 - 480 mcg/24 h   Creatinine, Urine mg/day-CATEUR 2.29 (H) 0.50 - 2.15 g/24 h  Cortisol,free,24 hour  urine w/creatinine  Result Value Ref Range   24 Hour urine volume (VMAHVA) 1,700 mL   Cortisol (Ur), Free 29.1 4.0 - 50.0 mcg/24 h   CREATININE, URINE 2.22 (H) 0.50 - 2.15 g/24 h      Assessment & Plan:   Problem List Items Addressed This Visit       Cardiovascular and Mediastinum   Essential hypertension - Primary    Continue triamterene-hydrochlorothiazide  37.5 mg-25 mg daily.continue with current treatment. Recommend continued lifestyle modification.       Relevant Medications   triamterene-hydrochlorothiazide (DYAZIDE) 37.5-25 MG capsule   Other Relevant Orders   CBC with Differential/Platelet   COMPLETE METABOLIC  PANEL WITH GFR     Other   Class 3 severe obesity due to excess calories in adult [facility])    Will try to get patient approved for wegovy. Continue lifestyle modification      Relevant Medications   Semaglutide-Weight Management 0.25 MG/0.5ML SOAJ   Adrenal nodule ([facility])    Keep follow up appointment with endocrinology      Other Visit Diagnoses     Screening for cholesterol level       Relevant Orders   Lipid panel   Screening for diabetes mellitus       Relevant Orders   Hemoglobin A1c        Follow up plan: Return in about 6 months (around 11/09/2022) for follow up, 3 month follow up after starting wegovy if approved.

## 2022-05-11 ENCOUNTER — Ambulatory Visit (INDEPENDENT_AMBULATORY_CARE_PROVIDER_SITE_OTHER): Payer: BC Managed Care – PPO | Admitting: Nurse Practitioner

## 2022-05-11 ENCOUNTER — Other Ambulatory Visit: Payer: Self-pay

## 2022-05-11 ENCOUNTER — Encounter: Payer: Self-pay | Admitting: Nurse Practitioner

## 2022-05-11 VITALS — BP 132/84 | HR 74 | Temp 98.6°F | Resp 16 | Ht 69.0 in | Wt 297.6 lb

## 2022-05-11 DIAGNOSIS — I10 Essential (primary) hypertension: Secondary | ICD-10-CM | POA: Diagnosis not present

## 2022-05-11 DIAGNOSIS — Z6841 Body Mass Index (BMI) 40.0 and over, adult: Secondary | ICD-10-CM

## 2022-05-11 DIAGNOSIS — E66813 Obesity, class 3: Secondary | ICD-10-CM

## 2022-05-11 DIAGNOSIS — E278 Other specified disorders of adrenal gland: Secondary | ICD-10-CM

## 2022-05-11 DIAGNOSIS — Z131 Encounter for screening for diabetes mellitus: Secondary | ICD-10-CM | POA: Diagnosis not present

## 2022-05-11 DIAGNOSIS — Z13220 Encounter for screening for lipoid disorders: Secondary | ICD-10-CM | POA: Diagnosis not present

## 2022-05-11 DIAGNOSIS — E279 Disorder of adrenal gland, unspecified: Secondary | ICD-10-CM

## 2022-05-11 MED ORDER — TRIAMTERENE-HCTZ 37.5-25 MG PO CAPS: 1.0000 | ORAL_CAPSULE | Freq: Every morning | ORAL | 1 refills | Status: DC

## 2022-05-11 MED ORDER — SEMAGLUTIDE-WEIGHT MANAGEMENT 0.25 MG/0.5ML ~~LOC~~ SOAJ: 0.2500 mg | SUBCUTANEOUS | 0 refills | Status: AC

## 2022-05-11 NOTE — Assessment & Plan Note (Signed)
Continue triamterene-hydrochlorothiazide  37.5 mg-25 mg daily.continue with current treatment. Recommend continued lifestyle modification.

## 2022-05-11 NOTE — Assessment & Plan Note (Signed)
Keep follow up appointment with endocrinology

## 2022-05-11 NOTE — Assessment & Plan Note (Signed)
Will try to get patient approved for wegovy. Continue lifestyle modification

## 2022-05-12 LAB — COMPLETE METABOLIC PANEL WITH GFR
AG Ratio: 1.3 (calc) (ref 1.0–2.5)
ALT: 17 U/L (ref 6–29)
AST: 19 U/L (ref 10–35)
Albumin: 3.8 g/dL (ref 3.6–5.1)
Alkaline phosphatase (APISO): 91 U/L (ref 37–153)
BUN: 13 mg/dL (ref 7–25)
CO2: 31 mmol/L (ref 20–32)
Calcium: 9.6 mg/dL (ref 8.6–10.4)
Chloride: 104 mmol/L (ref 98–110)
Creat: 0.97 mg/dL (ref 0.50–1.03)
Globulin: 3 g/dL (calc) (ref 1.9–3.7)
Glucose, Bld: 89 mg/dL (ref 65–99)
Potassium: 3.7 mmol/L (ref 3.5–5.3)
Sodium: 141 mmol/L (ref 135–146)
Total Bilirubin: 0.3 mg/dL (ref 0.2–1.2)
Total Protein: 6.8 g/dL (ref 6.1–8.1)
eGFR: 71 mL/min/{1.73_m2} (ref >=60)

## 2022-05-12 LAB — CBC WITH DIFFERENTIAL/PLATELET
Absolute Monocytes: 339 cells/uL (ref 200–950)
Basophils Absolute: 32 cells/uL (ref 0–200)
Basophils Relative: 0.6 %
Eosinophils Absolute: 159 cells/uL (ref 15–500)
Eosinophils Relative: 3 %
HCT: 37.6 % (ref 35.0–45.0)
Hemoglobin: 12.6 g/dL (ref 11.7–15.5)
Lymphs Abs: 2099 cells/uL (ref 850–3900)
MCH: 29.4 pg (ref 27.0–33.0)
MCHC: 33.5 g/dL (ref 32.0–36.0)
MCV: 87.6 fL (ref 80.0–100.0)
MPV: 9.7 fL (ref 7.5–12.5)
Monocytes Relative: 6.4 %
Neutro Abs: 2671 cells/uL (ref 1500–7800)
Neutrophils Relative %: 50.4 %
Platelets: 316 10*3/uL (ref 140–400)
RBC: 4.29 10*6/uL (ref 3.80–5.10)
RDW: 13.2 % (ref 11.0–15.0)
Total Lymphocyte: 39.6 %
WBC: 5.3 10*3/uL (ref 3.8–10.8)

## 2022-05-12 LAB — HEMOGLOBIN A1C
Hgb A1c MFr Bld: 5.8 % of total Hgb — ABNORMAL HIGH (ref <5.7)
Mean Plasma Glucose: 120 mg/dL
eAG (mmol/L): 6.6 mmol/L

## 2022-05-12 LAB — LIPID PANEL
Cholesterol: 189 mg/dL (ref <200)
HDL: 72 mg/dL (ref >=50)
LDL Cholesterol (Calc): 102 mg/dL (calc) — ABNORMAL HIGH
Non-HDL Cholesterol (Calc): 117 mg/dL (calc) (ref <130)
Total CHOL/HDL Ratio: 2.6 (calc) (ref <5.0)
Triglycerides: 61 mg/dL (ref <150)

## 2022-05-29 ENCOUNTER — Encounter: Payer: Self-pay | Admitting: Nurse Practitioner

## 2022-07-25 ENCOUNTER — Ambulatory Visit
Admission: EM | Admit: 2022-07-25 | Discharge: 2022-07-25 | Disposition: A | Discharge status: Home / Self Care | Payer: BC Managed Care – PPO | Attending: Emergency Medicine | Admitting: Emergency Medicine

## 2022-07-25 DIAGNOSIS — Z1152 Encounter for screening for COVID-19: Secondary | ICD-10-CM | POA: Diagnosis not present

## 2022-07-25 DIAGNOSIS — J4520 Mild intermittent asthma, uncomplicated: Secondary | ICD-10-CM | POA: Insufficient documentation

## 2022-07-25 DIAGNOSIS — R0981 Nasal congestion: Secondary | ICD-10-CM | POA: Insufficient documentation

## 2022-07-25 DIAGNOSIS — J302 Other seasonal allergic rhinitis: Secondary | ICD-10-CM | POA: Diagnosis not present

## 2022-07-25 NOTE — ED Provider Notes (Signed)
Renaldo Fiddler    CSN: [id number] Arrival date & time: 07/25/22  1132      History   Chief Complaint Chief Complaint  Patient presents with   Loss of Taste/ Smell    HPI Ann Morgan is a 52 y.o. female.  Patient presents with 1 week history of nasal congestion, postnasal drip, loss of taste and smell.  She had negative COVID test at home but would like to have the PCR test here.  No fever, ear pain, sore throat, cough, wheezing, shortness of breath, or other symptoms.  No OTC medications taken today; Previously had taken NyQuil and Mucinex.  Her medical history includes hypertension, obesity, asthma.  The history is provided by the patient and medical records.    Past Medical History:  Diagnosis Date   Abnormal Pap smear of cervix    Asthma    Endometrial hyperplasia    Hypertension 2004   Irregular menses    Mild intermittent asthma with acute exacerbation 10/19/2013   Late 2013 with a cough that lasted approximately one month.  PFTs showed FEV1 of 83%  Last Assessment & Plan:  Reports that since she was prescribed albuterol, she has used it maybe 1x to see how she would respond to it which seemed to help. However, she still has some coughing issues but thinks it is manageable. No issues with high pollen season.   Plan: - Continue with albuterol PRN.  - She will   Obesity     Patient Active Problem List   Diagnosis Date Noted   Adrenal nodule (HCC) 11/09/2021   Abdominal pain, left lower quadrant 08/31/2021   Left ovarian cyst 08/31/2021   Ovary, ovarian pedicle and fallopian tube torsion 08/31/2021   Abdominal pain 08/23/2021   Complications affecting other specified body systems, hypertension 09/02/2020   Muscle weakness 09/02/2020   Traumatic complete tear of right rotator cuff 04/10/2019   Epidermal cyst of neck 02/17/2018   Skin abnormalities 07/15/2017   Endometrial hyperplasia without atypia, simple 05/17/2017   Patellofemoral pain syndrome of both  knees 02/04/2017   Vasovagal syncope 02/04/2017   Glucose intolerance 01/01/2017   Overactive bladder 01/01/2017   Body mass index (BMI) of 45.0-49.9 in adult [facility]) 09/19/2015   Injury of left wrist 05/09/2015   Screening for STD (sexually transmitted disease) 04/06/2015   Varicose veins of both lower extremities with pain 10/27/2014   Breast cyst 12/11/2012   Abnormal mammogram 06/09/2012   Premenopausal menorrhagia 04/28/2012   Polycystic ovaries 03/29/2010   Class 3 severe obesity due to excess calories in adult [facility]) 03/29/2010   Essential hypertension 03/29/2010    Past Surgical History:  Procedure Laterality Date   BREAST CYST ASPIRATION Left 2015   benign   CYSTOSCOPY  08/31/2021   Procedure: CYSTOSCOPY;  Surgeon: Conard Novak, MD;  Location: [facility] ORS;  Service: Gynecology;;   KNEE SURGERY Left jan 2011   orthoscopic   ROBOTIC ASSISTED LAPAROSCOPIC LYSIS OF ADHESION  08/31/2021   Procedure: XI ROBOTIC ASSISTED LAPAROSCOPIC LYSIS OF ADHESION;  Surgeon: Conard Novak, MD;  Location: [facility] ORS;  Service: Gynecology;;   ROBOTIC ASSISTED LAPAROSCOPIC OVARIAN CYSTECTOMY Left 08/31/2021   Procedure: XI ROBOTIC ASSISTED LAPAROSCOPIC LEFT SALPINGO OOPHORECTOMY CYSTECTOMY;  Surgeon: Conard Novak, MD;  Location: [facility] ORS;  Service: Gynecology;  Laterality: Left;   SHOULDER ARTHROSCOPY WITH SUBACROMIAL DECOMPRESSION, ROTATOR CUFF REPAIR AND BICEP TENDON REPAIR Right 12/25/2018   Procedure: right shoulder arthroscopy, biceps tendon release with tenodesis, three  tendon rotator cuff repair;  Surgeon: Cammy Copa, MD;  Location: [facility] OR;  Service: Orthopedics;  Laterality: Right;    OB History     Gravida  0   Para  0   Term  0   Preterm  0   AB  0   Living  0      SAB  0   IAB  0   Ectopic  0   Multiple  0   Live Births  0        Obstetric Comments  First menstrual at age 43          Home Medications    Prior to Admission medications    Medication Sig Start Date End Date Taking? Authorizing Provider  Prenatal Vit-Fe Fumarate-FA (PRENATAL MULTIVITAMIN) TABS Take 1 tablet by mouth daily.     [provider]  triamterene-hydrochlorothiazide (DYAZIDE) 37.5-25 MG capsule Take 1 each (1 capsule total) by mouth every morning. 05/11/22   Berniece Salines, FNP    Family History Family History  Problem Relation Age of Onset   Hypertension Mother    Diabetes Brother    Hypertension Brother    Breast cancer Maternal Grandmother 32    Social History Social History   Tobacco Use   Smoking status: Never   Smokeless tobacco: Never  Vaping Use   Vaping Use: Never used  Substance Use Topics   Alcohol use: No   Drug use: No     Allergies   Patient has no known allergies.   Review of Systems Review of Systems  Constitutional:  Negative for chills and fever.  HENT:  Positive for congestion and postnasal drip. Negative for ear pain and sore throat.   Respiratory:  Negative for cough, shortness of breath and wheezing.   Cardiovascular:  Negative for chest pain and palpitations.  All other systems reviewed and are negative.    Physical Exam Triage Vital Signs ED Triage Vitals  Enc Vitals Group     BP --      Pulse Rate 07/25/22 1144 80     Resp 07/25/22 1144 18     Temp 07/25/22 1144 98 F (36.7 C)     Temp src --      SpO2 07/25/22 1144 97 %     Weight --      Height --      Head Circumference --      Peak Flow --      Pain Score 07/25/22 1159 0     Pain Loc --      Pain Edu? --      Excl. in GC? --    No data found.  Updated Vital Signs BP 123/86   Pulse 80   Temp 98 F (36.7 C)   Resp 18   LMP 11/17/2021   SpO2 97%   Visual Acuity Right Eye Distance:   Left Eye Distance:   Bilateral Distance:    Right Eye Near:   Left Eye Near:    Bilateral Near:     Physical Exam Vitals and nursing note reviewed.  Constitutional:      General: She is not in acute distress.    Appearance: She  is well-developed. She is not ill-appearing.  HENT:     Right Ear: Tympanic membrane normal.     Left Ear: Tympanic membrane normal.     Nose: Congestion and rhinorrhea present.     Mouth/Throat:     Mouth:  Mucous membranes are moist.     Pharynx: Oropharynx is clear.  Cardiovascular:     Rate and Rhythm: Normal rate and regular rhythm.     Heart sounds: Normal heart sounds.  Pulmonary:     Effort: Pulmonary effort is normal. No respiratory distress.     Breath sounds: Normal breath sounds.  Musculoskeletal:     Cervical back: Neck supple.  Skin:    General: Skin is warm and dry.  Neurological:     Mental Status: She is alert.  Psychiatric:        Mood and Affect: Mood normal.        Behavior: Behavior normal.      UC Treatments / Results  Labs (all labs ordered are listed, but only abnormal results are displayed) Labs Reviewed  SARS CORONAVIRUS 2 (TAT 6-24 HRS)    EKG   Radiology No results found.  Procedures Procedures (including critical care time)  Medications Ordered in UC Medications - No data to display  Initial Impression / Assessment and Plan / UC Course  I have reviewed the triage vital signs and the nursing notes.  Pertinent labs & imaging results that were available during my care of the patient were reviewed by me and considered in my medical decision making (see chart for details).    Nasal congestion, seasonal allergies.  Per patient request, COVID pending.  Discussed symptomatic treatment including ibuprofen, plain Mucinex, Flonase.  Instructed patient to follow up with her PCP if symptoms are not improving.  She agrees to plan of care.   Final Clinical Impressions(s) / UC Diagnoses   Final diagnoses:  Nasal congestion  Seasonal allergies     Discharge Instructions      Your COVID test is pending.    Take ibuprofen and plain Mucinex as directed.  Use Flonase nasal spray as directed.    Follow-up with your primary care provider if  your symptoms are not improving.         ED Prescriptions   None    PDMP not reviewed this encounter.   Mickie Bail, NP 07/25/22 1228

## 2022-07-25 NOTE — Discharge Instructions (Addendum)
Your COVID test is pending.    Take ibuprofen and plain Mucinex as directed.  Use Flonase nasal spray as directed.    Follow-up with your primary care provider if your symptoms are not improving.

## 2022-07-25 NOTE — ED Triage Notes (Signed)
Patient to Urgent Care with complaints of nasal congestion/ loss of taste and smell. Thick and dark mucus productive.   Symptoms started over a week ago. Denies any fevers.   Using multiple otc meds (nyquil/ mucinex). Negative home covid test.

## 2022-07-26 LAB — SARS CORONAVIRUS 2 (TAT 6-24 HRS): SARS Coronavirus 2: NEGATIVE

## 2022-08-04 ENCOUNTER — Other Ambulatory Visit: Payer: Self-pay | Admitting: Nurse Practitioner

## 2022-08-04 DIAGNOSIS — I10 Essential (primary) hypertension: Secondary | ICD-10-CM

## 2022-08-06 NOTE — Telephone Encounter (Signed)
Rx 05/11/22 #90 1RF- too soon Requested Prescriptions  Pending Prescriptions Disp Refills   triamterene-hydrochlorothiazide (DYAZIDE) 37.5-25 MG capsule [Pharmacy Med Name: TRIAMTERENE 37.5MG / HCTZ 25MG  CAPS] 90 capsule 1    Sig: TAKE 1 CAPSULE BY MOUTH EVERY MORNING     Cardiovascular: Diuretic Combos Passed - 08/04/2022 10:05 AM      Passed - K in normal range and within 180 days    Potassium  Date Value Ref Range Status  05/11/2022 3.7 3.5 - 5.3 mmol/L Final         Passed - Na in normal range and within 180 days    Sodium  Date Value Ref Range Status  05/11/2022 141 135 - 146 mmol/L Final         Passed - Cr in normal range and within 180 days    Creat  Date Value Ref Range Status  05/11/2022 0.97 0.50 - 1.03 mg/dL Final         Passed - Last BP in normal range    BP Readings from Last 1 Encounters:  07/25/22 123/86         Passed - Valid encounter within last 6 months    Recent Outpatient Visits           2 months ago Essential hypertension   [facility] Berniece Salines, FNP   9 months ago Adrenal nodule [facility])   [facility] Berniece Salines, FNP   11 months ago Left lower quadrant abdominal pain   [facility] Berniece Salines, FNP   11 months ago Left lower quadrant abdominal pain   [facility] Caro Laroche, DO   1 year ago Varicose veins of both lower extremities with pain   [facility] Berniece Salines, FNP       Future Appointments             In 3 months Zane Herald, Rudolpho Sevin, FNP [facility]

## 2022-08-31 ENCOUNTER — Ambulatory Visit: Payer: BC Managed Care – PPO | Admitting: Internal Medicine

## 2022-09-18 ENCOUNTER — Encounter: Payer: Self-pay | Admitting: Nurse Practitioner

## 2022-09-18 ENCOUNTER — Other Ambulatory Visit: Payer: Self-pay

## 2022-09-18 DIAGNOSIS — I10 Essential (primary) hypertension: Secondary | ICD-10-CM

## 2022-09-18 MED ORDER — TRIAMTERENE-HCTZ 37.5-25 MG PO CAPS: 1.0000 | ORAL_CAPSULE | Freq: Every morning | ORAL | 1 refills | Status: DC

## 2022-10-02 DIAGNOSIS — I872 Venous insufficiency (chronic) (peripheral): Secondary | ICD-10-CM | POA: Diagnosis not present

## 2022-10-02 DIAGNOSIS — I87393 Chronic venous hypertension (idiopathic) with other complications of bilateral lower extremity: Secondary | ICD-10-CM | POA: Diagnosis not present

## 2022-10-02 DIAGNOSIS — I10 Essential (primary) hypertension: Secondary | ICD-10-CM | POA: Diagnosis not present

## 2022-10-02 DIAGNOSIS — L299 Pruritus, unspecified: Secondary | ICD-10-CM | POA: Diagnosis not present

## 2022-10-05 ENCOUNTER — Ambulatory Visit (INDEPENDENT_AMBULATORY_CARE_PROVIDER_SITE_OTHER): Payer: BC Managed Care – PPO | Admitting: Orthopedic Surgery

## 2022-10-05 ENCOUNTER — Encounter: Payer: Self-pay | Admitting: Orthopedic Surgery

## 2022-10-05 ENCOUNTER — Other Ambulatory Visit (INDEPENDENT_AMBULATORY_CARE_PROVIDER_SITE_OTHER): Payer: BC Managed Care – PPO

## 2022-10-05 DIAGNOSIS — M25562 Pain in left knee: Secondary | ICD-10-CM | POA: Diagnosis not present

## 2022-10-05 DIAGNOSIS — M659 Synovitis and tenosynovitis, unspecified: Secondary | ICD-10-CM | POA: Diagnosis not present

## 2022-10-05 MED ORDER — BUPIVACAINE HCL 0.25 % IJ SOLN
4.0000 mL | INTRAMUSCULAR | Status: AC | PRN
  Administered 2022-10-05: 4 mL via INTRA_ARTICULAR

## 2022-10-05 MED ORDER — METHYLPREDNISOLONE ACETATE 40 MG/ML IJ SUSP
40.0000 mg | INTRAMUSCULAR | Status: AC | PRN
Start: 2022-10-05 — End: 2022-10-05
  Administered 2022-10-05: 40 mg via INTRA_ARTICULAR

## 2022-10-05 MED ORDER — LIDOCAINE HCL 1 % IJ SOLN
5.0000 mL | INTRAMUSCULAR | Status: AC | PRN
  Administered 2022-10-05: 5 mL

## 2022-10-05 NOTE — Progress Notes (Signed)
Office Visit Note   Patient: Ann Morgan           Date of Birth: 03/03/1971           MRN: [id number] Visit Date: 10/05/2022 Requested by: Berniece Salines, FNP [address] [place],  [place] [address] PCP: Berniece Salines, FNP  Subjective: Chief Complaint  Patient presents with   Other     Bilateral knee pain     HPI: Ann Morgan is a 52 y.o. female who presents to the office reporting bilateral knee pain left worse than right.  She does report having some right knee swelling.  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  She also describes having prior "meniscal surgery" in the left knee about 15 years ago.  She states that the left knee is hard to bend and hard to kneel.  She did have a right knee injection earlier this year which gave her good results.  Lateral side hurts her little bit more than the medial side in that left knee.  Patient states she has gained weight since the beginning of the year.  Symptoms ongoing relatively significantly for the last 2 months.  Difficulty going from sitting to standing..                ROS: All systems reviewed are negative as they relate to the chief complaint within the history of present illness.  Patient denies fevers or chills.  Assessment & Plan: Visit Diagnoses:  1. Left knee pain, unspecified chronicity     Plan: Impression is left knee pain with not much effusion and lateral greater than medial joint line tenderness.  Hard to say which side had a meniscal pathology.  Ligaments are stable.  Does have slightly limited flexion but full extension on that left-hand side.  Plan at this time is injection of that left knee and she will call us in 4 weeks to let us know how she is doing and we can decide for or against MRI scanning at that time.  Would like for her to continue her program of nonweightbearing quad strengthening exercises.  She has been taking some  over-the-counter medication as needed and has modified her activity to account for her symptoms.  Follow-Up Instructions: No follow-ups on file.   Orders:  Orders Placed This Encounter  Procedures   XR KNEE 3 VIEW LEFT   No orders of the defined types were placed in this encounter.     Procedures: Large Joint Inj: L knee on 10/05/2022 6:53 PM Indications: diagnostic evaluation, joint swelling and pain Details: 18 G 1.5 in needle, superolateral approach  Arthrogram: No  Medications: 5 mL lidocaine 1 %; 40 mg methylPREDNISolone acetate 40 MG/ML; 4 mL bupivacaine 0.25 % Outcome: tolerated well, no immediate complications Procedure, treatment alternatives, risks and benefits explained, specific risks discussed. Consent was given by the patient. Immediately prior to procedure a time out was called to verify the correct patient, procedure, equipment, support staff and site/side marked as required. Patient was prepped and draped in the usual sterile fashion.       Clinical Data: No additional findings.  Objective: Vital Signs: LMP 11/17/2021   Physical Exam:  Constitutional: Patient appears well-developed HEENT:  Head: Normocephalic Eyes:EOM are normal Neck: Normal range of motion Cardiovascular: Normal rate Pulmonary/chest: Effort normal Neurologic: Patient is alert Skin: Skin is warm Psychiatric: Patient has normal mood and affect  Ortho Exam: Ortho  exam demonstrates normal gait and alignment.  No effusion in the left knee.  No patellofemoral crepitus to the left knee.  Collateral crucial ligaments are stable.  Has more lateral than medial joint line tenderness with equivocal McMurray compression testing for both compartments.  Extensor mechanism intact and nontender with palpable pedal pulses and no other masses lymphadenopathy or skin changes noted in that left knee region.  Specialty Comments:  No specialty comments available.  Imaging: XR KNEE 3 VIEW LEFT  Result  Date: 10/05/2022 AP lateral merchant radiographs left knee reviewed.  Patient has normal alignment.  Very mild medial joint space narrowing.  Patellofemoral and lateral joint spaces are preserved.  No acute fracture.    PMFS History: Patient Active Problem List   Diagnosis Date Noted   Adrenal nodule (HCC) 11/09/2021   Abdominal pain, left lower quadrant 08/31/2021   Left ovarian cyst 08/31/2021   Ovary, ovarian pedicle and fallopian tube torsion 08/31/2021   Abdominal pain 08/23/2021   Complications affecting other specified body systems, hypertension 09/02/2020   Muscle weakness 09/02/2020   Traumatic complete tear of right rotator cuff 04/10/2019   Epidermal cyst of neck 02/17/2018   Skin abnormalities 07/15/2017   Endometrial hyperplasia without atypia, simple 05/17/2017   Patellofemoral pain syndrome of both knees 02/04/2017   Vasovagal syncope 02/04/2017   Glucose intolerance 01/01/2017   Overactive bladder 01/01/2017   Body mass index (BMI) of 45.0-49.9 in adult [facility]) 09/19/2015   Injury of left wrist 05/09/2015   Screening for STD (sexually transmitted disease) 04/06/2015   Varicose veins of both lower extremities with pain 10/27/2014   Breast cyst 12/11/2012   Abnormal mammogram 06/09/2012   Premenopausal menorrhagia 04/28/2012   Polycystic ovaries 03/29/2010   Class 3 severe obesity due to excess calories in adult [facility]) 03/29/2010   Essential hypertension 03/29/2010   Past Medical History:  Diagnosis Date   Abnormal Pap smear of cervix    Asthma    Endometrial hyperplasia    Hypertension 2004   Irregular menses    Mild intermittent asthma with acute exacerbation 10/19/2013   Late 2013 with a cough that lasted approximately one month.  PFTs showed FEV1 of 83%  Last Assessment & Plan:  Reports that since she was prescribed albuterol, she has used it maybe 1x to see how she would respond to it which seemed to help. However, she still has some coughing issues but thinks it  is manageable. No issues with high pollen season.   Plan: - Continue with albuterol PRN.  - She will   Obesity     Family History  Problem Relation Age of Onset   Hypertension Mother    Diabetes Brother    Hypertension Brother    Breast cancer Maternal Grandmother 34    Past Surgical History:  Procedure Laterality Date   BREAST CYST ASPIRATION Left 2015   benign   CYSTOSCOPY  08/31/2021   Procedure: CYSTOSCOPY;  Surgeon: Conard Novak, MD;  Location: [facility] ORS;  Service: Gynecology;;   KNEE SURGERY Left jan 2011   orthoscopic   ROBOTIC ASSISTED LAPAROSCOPIC LYSIS OF ADHESION  08/31/2021   Procedure: XI ROBOTIC ASSISTED LAPAROSCOPIC LYSIS OF ADHESION;  Surgeon: Conard Novak, MD;  Location: [facility] ORS;  Service: Gynecology;;   ROBOTIC ASSISTED LAPAROSCOPIC OVARIAN CYSTECTOMY Left 08/31/2021   Procedure: XI ROBOTIC ASSISTED LAPAROSCOPIC LEFT SALPINGO OOPHORECTOMY CYSTECTOMY;  Surgeon: Conard Novak, MD;  Location: [facility] ORS;  Service: Gynecology;  Laterality: Left;   SHOULDER ARTHROSCOPY  WITH SUBACROMIAL DECOMPRESSION, ROTATOR CUFF REPAIR AND BICEP TENDON REPAIR Right 12/25/2018   Procedure: right shoulder arthroscopy, biceps tendon release with tenodesis, three tendon rotator cuff repair;  Surgeon: Cammy Copa, MD;  Location: [facility] OR;  Service: Orthopedics;  Laterality: Right;   Social History   Occupational History   Not on file  Tobacco Use   Smoking status: Never   Smokeless tobacco: Never  Vaping Use   Vaping status: Never Used  Substance and Sexual Activity   Alcohol use: No   Drug use: No   Sexual activity: Yes    Partners: Male    Birth control/protection: None

## 2022-10-31 DIAGNOSIS — H35033 Hypertensive retinopathy, bilateral: Secondary | ICD-10-CM | POA: Diagnosis not present

## 2022-11-08 NOTE — Progress Notes (Signed)
BP 128/82   Pulse 80   Temp 98.8 F (37.1 C) (Oral)   Resp 16   Ht 5\' 9"  (1.753 m)   Wt (!) 311 lb 3.2 oz (141.2 kg)   LMP 11/17/2021   SpO2 97%   BMI 45.96 kg/m    Subjective:    Patient ID: Ann Morgan, female    DOB: [birth year], 52 y.o.   MRN: [id number]  HPI: Ann Morgan is a 52 y.o. female  Chief Complaint  Patient presents with   Hypertension   Obesity   Leg Pain    Right leg   Hypertension:  -Medications:  triamterene-hydrochlorothiazide  37.5 mg-25 mg daily.  -Patient is compliant with above medications and reports no side effects. -Checking BP at home (average): does not check at home -Denies any SOB, CP, vision changes, LE edema or symptoms of hypotension -Diet: recommend DASH diet  -Exercise: recommend 150 min of physical activity weekly       11/09/2022    8:46 AM 07/25/2022   12:17 PM 07/25/2022   11:44 AM  Vitals with BMI  Height 5\' 9"     Weight 311 lbs 3 oz    BMI 45.94    Systolic 128 123   Diastolic 82 86   Pulse 80  80    Adrenal nodule:  incidental finding on ct scan showed adrenal nodule. Patient is now established with endocrinology and is following this.  She will go back in September for repeat labs. No changes.   Obesity:  Current weight : 311 lbs BMI: 45.96 previous weight:297 lbs Treatment Tried: lifestyle modification, previously tried to get her approved for wegovy.  She was approved but unable to find a pharmacy that had the prescription. She is going to continue working on lifestyle modification.  Comorbidities: HTN, hld  HLD:  -Medications: none -Patient is working on lifestyle modification -Last lipid panel:  Lipid Panel     Component Value Date/Time   CHOL 189 05/11/2022 0931   TRIG 61 05/11/2022 0931   HDL 72 05/11/2022 0931   CHOLHDL 2.6 05/11/2022 0931   LDLCALC 102 (H) 05/11/2022 0931   The 10-year ASCVD risk score (Arnett DK, et al., 2019) is: 2.2%   Values used to calculate the score:     Age:  50 years     Sex: Female     Is Non-Hispanic African American: Yes     Diabetic: No     Tobacco smoker: No     Systolic Blood Pressure: 128 mmHg     Is BP treated: Yes     HDL Cholesterol: 72 mg/dL     Total Cholesterol: 189 mg/dL    Tingling/numbness in right thigh:she says she has been dealing with this for years. She says that it has gotten worse and now does not matter what position she is on.  She says it does not hurt but is tender to the touch. She saw a vein specialist and they could not find the cause.  That provider suggested to patient that it was a pinched nerve.  Patient reports she does not have any back pain. Recommend checking b12 . Discussed case with Dr. Carlynn Purl she came and evaluated patient and diagnosed her with Meralgia paresthetica.  Recommend weight loss.  Can do gabapentin for pain, will hold off for now.   Relevant past medical, surgical, family and social history reviewed and updated as indicated. Interim medical history since our last visit reviewed. Allergies and medications  reviewed and updated.  Review of Systems  Constitutional: Negative for fever or weight change.  Respiratory: Negative for cough and shortness of breath.   Cardiovascular: Negative for chest pain or palpitations.  Gastrointestinal: Negative for abdominal pain, no bowel changes.  Musculoskeletal: Negative for gait problem or joint swelling. Left leg tenderness, tingling and numbness Skin: Negative for rash.  Neurological: Negative for dizziness or headache.  No other specific complaints in a complete review of systems (except as listed in HPI above).      Objective:    BP 128/82   Pulse 80   Temp 98.8 F (37.1 C) (Oral)   Resp 16   Ht 5\' 9"  (1.753 m)   Wt (!) 311 lb 3.2 oz (141.2 kg)   LMP 11/17/2021   SpO2 97%   BMI 45.96 kg/m   Wt Readings from Last 3 Encounters:  11/09/22 (!) 311 lb 3.2 oz (141.2 kg)  05/11/22 297 lb 9.6 oz (135 kg)  11/28/21 285 lb 6.4 oz (129.5 kg)     Physical Exam  Constitutional: Patient appears well-developed and well-nourished. Obese  No distress.  HEENT: head atraumatic, normocephalic, pupils equal and reactive to light, neck supple, Cardiovascular: Normal rate, regular rhythm and normal heart sounds.  No murmur heard. No BLE edema. Pulmonary/Chest: Effort normal and breath sounds normal. No respiratory distress. Abdominal: Soft.  There is no tenderness. MSK:  right thigh tenderness Psychiatric: Patient has a normal mood and affect. behavior is normal. Judgment and thought content normal.      Assessment & Plan:   Problem List Items Addressed This Visit       Cardiovascular and Mediastinum   Essential hypertension - Primary    Continue  triamterene-hydrochlorothiazide  37.5 mg-25 mg daily. Recommend working following DASH diet      Relevant Orders   CBC with Differential/Platelet   COMPLETE METABOLIC PANEL WITH GFR     Nervous and Auditory   Meralgia paresthetica of right side    Reassurance provided,  may choose gabapentin in the future but will hold off for now.         Other   Class 3 severe obesity due to excess calories in adult [facility])    Continue working on lifestyle modification      Adrenal nodule (HCC)    Has follow up with endo in September      Mixed hyperlipidemia    Continue working on lifestyle modification      Relevant Orders   Lipid panel   Other Visit Diagnoses     Screening for diabetes mellitus       Relevant Orders   Hemoglobin A1c   Numbness and tingling of right leg       Meralgia paresthetica of right side   Relevant Orders   Vitamin B12         Follow up plan: Return in about 6 months (around 05/12/2023) for follow up.

## 2022-11-09 ENCOUNTER — Ambulatory Visit (INDEPENDENT_AMBULATORY_CARE_PROVIDER_SITE_OTHER): Payer: BC Managed Care – PPO | Admitting: Nurse Practitioner

## 2022-11-09 ENCOUNTER — Encounter: Payer: Self-pay | Admitting: Nurse Practitioner

## 2022-11-09 ENCOUNTER — Other Ambulatory Visit: Payer: Self-pay

## 2022-11-09 VITALS — BP 128/82 | HR 80 | Temp 98.8°F | Resp 16 | Ht 69.0 in | Wt 311.2 lb

## 2022-11-09 DIAGNOSIS — E782 Mixed hyperlipidemia: Secondary | ICD-10-CM | POA: Diagnosis not present

## 2022-11-09 DIAGNOSIS — E278 Other specified disorders of adrenal gland: Secondary | ICD-10-CM | POA: Diagnosis not present

## 2022-11-09 DIAGNOSIS — I10 Essential (primary) hypertension: Secondary | ICD-10-CM | POA: Diagnosis not present

## 2022-11-09 DIAGNOSIS — R202 Paresthesia of skin: Secondary | ICD-10-CM

## 2022-11-09 DIAGNOSIS — R200 Anesthesia of skin: Secondary | ICD-10-CM | POA: Diagnosis not present

## 2022-11-09 DIAGNOSIS — G5711 Meralgia paresthetica, right lower limb: Secondary | ICD-10-CM

## 2022-11-09 DIAGNOSIS — Z131 Encounter for screening for diabetes mellitus: Secondary | ICD-10-CM

## 2022-11-09 DIAGNOSIS — Z6841 Body Mass Index (BMI) 40.0 and over, adult: Secondary | ICD-10-CM

## 2022-11-09 NOTE — Assessment & Plan Note (Signed)
Has follow up with endo in September

## 2022-11-09 NOTE — Assessment & Plan Note (Signed)
Continue working on lifestyle modification.  

## 2022-11-09 NOTE — Assessment & Plan Note (Signed)
Continue  triamterene-hydrochlorothiazide  37.5 mg-25 mg daily. Recommend working following DASH diet

## 2022-11-09 NOTE — Assessment & Plan Note (Signed)
Reassurance provided,  may choose gabapentin in the future but will hold off for now.

## 2022-11-10 LAB — COMPLETE METABOLIC PANEL WITH GFR
AG Ratio: 1.4 (calc) (ref 1.0–2.5)
ALT: 18 U/L (ref 6–29)
AST: 22 U/L (ref 10–35)
Albumin: 3.9 g/dL (ref 3.6–5.1)
Alkaline phosphatase (APISO): 89 U/L (ref 37–153)
BUN: 18 mg/dL (ref 7–25)
CO2: 30 mmol/L (ref 20–32)
Calcium: 9.3 mg/dL (ref 8.6–10.4)
Chloride: 105 mmol/L (ref 98–110)
Creat: 0.95 mg/dL (ref 0.50–1.03)
Globulin: 2.8 g/dL (ref 1.9–3.7)
Glucose, Bld: 87 mg/dL (ref 65–99)
Potassium: 3.7 mmol/L (ref 3.5–5.3)
Sodium: 142 mmol/L (ref 135–146)
Total Bilirubin: 0.3 mg/dL (ref 0.2–1.2)
Total Protein: 6.7 g/dL (ref 6.1–8.1)
eGFR: 73 mL/min/{1.73_m2} (ref >=60)

## 2022-11-10 LAB — CBC WITH DIFFERENTIAL/PLATELET
Absolute Monocytes: 368 {cells}/uL (ref 200–950)
Basophils Absolute: 29 {cells}/uL (ref 0–200)
Basophils Relative: 0.6 %
Eosinophils Absolute: 98 {cells}/uL (ref 15–500)
Eosinophils Relative: 2 %
HCT: 36.9 % (ref 35.0–45.0)
Hemoglobin: 12.2 g/dL (ref 11.7–15.5)
Lymphs Abs: 1612 {cells}/uL (ref 850–3900)
MCH: 29.2 pg (ref 27.0–33.0)
MCHC: 33.1 g/dL (ref 32.0–36.0)
MCV: 88.3 fL (ref 80.0–100.0)
MPV: 9.9 fL (ref 7.5–12.5)
Monocytes Relative: 7.5 %
Neutro Abs: 2793 {cells}/uL (ref 1500–7800)
Neutrophils Relative %: 57 %
Platelets: 322 10*3/uL (ref 140–400)
RBC: 4.18 10*6/uL (ref 3.80–5.10)
RDW: 13 % (ref 11.0–15.0)
Total Lymphocyte: 32.9 %
WBC: 4.9 10*3/uL (ref 3.8–10.8)

## 2022-11-10 LAB — HEMOGLOBIN A1C
Hgb A1c MFr Bld: 5.9 %{Hb} — ABNORMAL HIGH (ref <5.7)
Mean Plasma Glucose: 123 mg/dL
eAG (mmol/L): 6.8 mmol/L

## 2022-11-10 LAB — LIPID PANEL
Cholesterol: 170 mg/dL (ref <200)
HDL: 66 mg/dL (ref >=50)
LDL Cholesterol (Calc): 90 mg/dL
Non-HDL Cholesterol (Calc): 104 mg/dL (ref <130)
Total CHOL/HDL Ratio: 2.6 (calc) (ref <5.0)
Triglycerides: 59 mg/dL (ref <150)

## 2022-11-10 LAB — VITAMIN B12: Vitamin B-12: 450 pg/mL (ref 200–1100)

## 2022-11-30 ENCOUNTER — Ambulatory Visit (INDEPENDENT_AMBULATORY_CARE_PROVIDER_SITE_OTHER): Payer: BC Managed Care – PPO | Admitting: Internal Medicine

## 2022-11-30 ENCOUNTER — Encounter: Payer: Self-pay | Admitting: Internal Medicine

## 2022-11-30 VITALS — BP 120/84 | HR 70 | Ht 69.0 in | Wt 314.0 lb

## 2022-11-30 DIAGNOSIS — G5711 Meralgia paresthetica, right lower limb: Secondary | ICD-10-CM

## 2022-11-30 DIAGNOSIS — D3502 Benign neoplasm of left adrenal gland: Secondary | ICD-10-CM

## 2022-11-30 DIAGNOSIS — R635 Abnormal weight gain: Secondary | ICD-10-CM

## 2022-11-30 NOTE — Progress Notes (Unsigned)
Name: Ann Morgan  MRN/ DOB: [id number], 01/27/1971    Age/ Sex: 52 y.o., female    PCP: Berniece Salines, FNP   Reason for Endocrinology Evaluation: Left adrenal adenoma     Date of Initial Endocrinology Evaluation: 11/28/2021    HPI: Ann Morgan is a 52 y.o. female with a past medical history of HTN, asthma. The patient presented for initial endocrinology clinic visit on 11/28/2021 for consultative assistance with her left adrenal adenoma.   During evaluation for abdominal pain the patient has been noted on CT imaging to have 2 left adrenal nodules, one of them is 1 cm and the other is 1.4 cm.  She is S/P left ovarian cystectomy 08/2021  In 11/2021 she had normal 24-hour cortisol of 29.1 mcg, catecholamines, normal aldosterone, renin and elevated Aldo/renin ratio   SUBJECTIVE:    Today (11/30/22):  Ann Morgan is here for follow-up on left adrenal adenoma    Substantial weight gain- yes attributes to lack of exercise due to knee pain Easy bruisability- no Severe hypertension- no  DM- no but has pre-diabetes Proximal muscle weakness-no Sudden/ severe headaches-no Anxiety attacks-no Cardiac arrhythmias-no Palpitations-no Fluid retention- no Hypokalemia-yes 08/2021   Has right thigh tingling and numbness  Denies constipation or diarrhea  Bedtime is at 10 pm   Follow-up on   HISTORY:  Past Medical History:  Past Medical History:  Diagnosis Date   Abnormal Pap smear of cervix    Asthma    Endometrial hyperplasia    Hypertension 2004   Irregular menses    Mild intermittent asthma with acute exacerbation 10/19/2013   Late 2013 with a cough that lasted approximately one month.  PFTs showed FEV1 of 83%  Last Assessment & Plan:  Reports that since she was prescribed albuterol, she has used it maybe 1x to see how she would respond to it which seemed to help. However, she still has some coughing issues but thinks it is manageable. No issues  with high pollen season.   Plan: - Continue with albuterol PRN.  - She will   Obesity    Past Surgical History:  Past Surgical History:  Procedure Laterality Date   BREAST CYST ASPIRATION Left 2015   benign   CYSTOSCOPY  08/31/2021   Procedure: CYSTOSCOPY;  Surgeon: Conard Novak, MD;  Location: [facility] ORS;  Service: Gynecology;;   KNEE SURGERY Left jan 2011   orthoscopic   ROBOTIC ASSISTED LAPAROSCOPIC LYSIS OF ADHESION  08/31/2021   Procedure: XI ROBOTIC ASSISTED LAPAROSCOPIC LYSIS OF ADHESION;  Surgeon: Conard Novak, MD;  Location: [facility] ORS;  Service: Gynecology;;   ROBOTIC ASSISTED LAPAROSCOPIC OVARIAN CYSTECTOMY Left 08/31/2021   Procedure: XI ROBOTIC ASSISTED LAPAROSCOPIC LEFT SALPINGO OOPHORECTOMY CYSTECTOMY;  Surgeon: Conard Novak, MD;  Location: [facility] ORS;  Service: Gynecology;  Laterality: Left;   SHOULDER ARTHROSCOPY WITH SUBACROMIAL DECOMPRESSION, ROTATOR CUFF REPAIR AND BICEP TENDON REPAIR Right 12/25/2018   Procedure: right shoulder arthroscopy, biceps tendon release with tenodesis, three tendon rotator cuff repair;  Surgeon: Cammy Copa, MD;  Location: [facility] OR;  Service: Orthopedics;  Laterality: Right;    Social History:  reports that she has never smoked. She has never used smokeless tobacco. She reports that she does not drink alcohol and does not use drugs. Family History: family history includes Breast cancer (age of onset: 64) in her maternal grandmother; Diabetes in her brother; Hypertension in her brother and mother.   HOME MEDICATIONS: Allergies as of  11/30/2022   No Known Allergies      Medication List        Accurate as of November 30, 2022  2:20 PM. If you have any questions, ask your nurse or doctor.          prenatal multivitamin Tabs tablet Take 1 tablet by mouth daily.   triamterene-hydrochlorothiazide 37.5-25 MG capsule Commonly known as: DYAZIDE Take 1 each (1 capsule total) by mouth every morning.          REVIEW OF  SYSTEMS: A comprehensive ROS was conducted with the patient and is negative except as per HPI     OBJECTIVE:  VS: BP 120/84 (BP Location: Left Arm, Patient Position: Sitting, Cuff Size: Large)   Pulse 70   Ht 5\' 9"  (1.753 m)   Wt (!) 314 lb (142.4 kg)   LMP 11/17/2021   SpO2 99%   BMI 46.37 kg/m    Wt Readings from Last 3 Encounters:  11/30/22 (!) 314 lb (142.4 kg)  11/09/22 (!) 311 lb 3.2 oz (141.2 kg)  05/11/22 297 lb 9.6 oz (135 kg)     EXAM: General: Pt appears well and is in NAD  Neck: General: Supple without adenopathy. Thyroid: Thyroid size normal.  No goiter or nodules appreciated.  Lungs: Clear with good BS bilat with no rales, rhonchi, or wheezes  Heart: Auscultation: RRR.  Abdomen: Normoactive bowel sounds, soft, nontender  Extremities:  BL LE: trace pretibial edema   Mental Status: Judgment, insight: Intact Orientation: Oriented to time, place, and person Mood and affect: No depression, anxiety, or agitation     DATA REVIEWED:     Latest Reference Range & Units 11/09/22 09:38  Sodium 135 - 146 mmol/L 142  Potassium 3.5 - 5.3 mmol/L 3.7  Chloride 98 - 110 mmol/L 105  CO2 20 - 32 mmol/L 30  Glucose 65 - 99 mg/dL 87  Mean Plasma Glucose mg/dL 161  BUN 7 - 25 mg/dL 18  Creatinine 0.96 - 0.45 mg/dL 4.09  Calcium 8.6 - 81.1 mg/dL 9.3  BUN/Creatinine Ratio 6 - 22 (calc) SEE NOTE:  eGFR > OR = 60 mL/min/1.31m2 73  AG Ratio 1.0 - 2.5 (calc) 1.4  AST 10 - 35 U/L 22  ALT 6 - 29 U/L 18  Total Protein 6.1 - 8.1 g/dL 6.7  Total Bilirubin 0.2 - 1.2 mg/dL 0.3    CT abdomen 11/17/4780   Adrenals/Urinary Tract: Both kidneys demonstrate a few scattered tiny subcentimeter cortical renal cysts. No further imaging follow-up recommended. No hydronephrosis. No acute obstructive uropathy, perinephric inflammation or associated hydroureter. Ureters are symmetric and decompressed. Bladder collapsed.   Two small left adrenal nodules 1 measuring 17 mm and a  second measuring 14 mm. No available comparison studies. Lesions are indeterminate by contrast CT but certainly could be adenomas. Recommend follow-up nonemergent imaging with MRI utilizing an adrenal protocol.   Normal right adrenal gland    ASSESSMENT/PLAN/RECOMMENDATIONS:   Left adrenal Adenoma    -Patient has been noted with weight gain, but no other symptoms of hormonal hypersecretion -Will proceed with repeat imaging, she prefers CT over MRI - Will proceed with aldo, renin  -Will proceed with saliva cortisol x 2 -Catecholamines were normal 2023    2.  Weight gain:  -Patient attributes this due to lack of exercise due to knee pain as well as increase oral intake -I have recommended following a diet such as weight watchers or Noom -We also discussed options such as aquatic exercises  as she is limited with joint pains -Patient will be screened for Cushing syndrome   3.  Meralgia paresthetica:  -Did explain some patient mechanism of this, I do believe that weight loss will help with this as it will relieve the pressure over superficial nerves    F/U in 1 yr   I spent 30 minutes preparing to see the patient by review of recent labs, imaging and procedures, obtaining and reviewing separately obtained history, communicating with the patient, ordering medications, tests or procedures, and documenting clinical information in the EHR including the differential Dx, treatment, and any further evaluation and other management    Signed electronically by: Lyndle Herrlich, MD  [facility] Endocrinology  [facility] [address] [place] [place], [place] [address] Phone: [phone] FAX: [phone]   CC: Berniece Salines, FNP [address] [place] [address] Phone: [phone] Fax: [phone]   Return to Endocrinology clinic as below: Future Appointments  Date Time Provider Department Center  05/17/2023  9:00 AM Berniece Salines, FNP [facility]

## 2022-11-30 NOTE — Patient Instructions (Signed)
SALIVARY CORTISOL COLLECTION INSTRUCTIONS    Precautions:  Please collect sample at Midnight You will need to do this on 2 nights  No food or fluids 30 minutes prior to collection.  Do not use any creams, lotions on hands, or use steroid inhalers 24- hours prior to collection.  Wash hands carefully.  Avoid any activity that could cause your gums to bleed: including flushing of brushing your teeth.  Kit must not be used in children less than 50 years of age, or a person that is at risk for choking on collection kit.  Instructions for saliva collection:   Rinse mouth thoroughly with water and discard. Do not swallow.  Hold the Salivette at the rim of the suspended insert and remove the stopper.  Remove the swab.  Place swab under tongue until well saturated, approximately 1 minute.   Return the saturated swab to the suspended insert and close the Salivette firmly with the stopper.  Do not remove the tube holding the insert. The Salivette should be sent to the lab with the swab.   Come to the lab and leave the Salivette kit for labeling with your identifying information.   Make sure you refrigerate sample if not bringing to the lab immediately. Try to use cold packs for transportation if available.

## 2022-12-03 ENCOUNTER — Telehealth: Payer: Self-pay

## 2022-12-03 ENCOUNTER — Encounter: Payer: Self-pay | Admitting: Internal Medicine

## 2022-12-03 NOTE — Telephone Encounter (Signed)
Lab called stating that there was a processing error on the ACTH and it will need to be recollect.  Per Dr. Lonzo Cloud okay to hold off until next appointment

## 2022-12-03 NOTE — Telephone Encounter (Signed)
CT Abdomen needs to be sent to [facility] at [phone] per patient to have performed there.

## 2022-12-04 ENCOUNTER — Telehealth: Payer: Self-pay | Admitting: Internal Medicine

## 2022-12-04 NOTE — Telephone Encounter (Signed)
Can you please speak to the phlebotomist and find out why they canceled her labs?  Patient will need to return for repeat labs   Thanks

## 2022-12-05 NOTE — Telephone Encounter (Signed)
Per [employer]  stated that she spoke wit [facility] and Bmp was not enough blood to run test. The ACTH sample was deteriorated in process. Both test need to be recollected. The other 2 test are still in process. They were not Cancelled.

## 2022-12-06 ENCOUNTER — Other Ambulatory Visit (INDEPENDENT_AMBULATORY_CARE_PROVIDER_SITE_OTHER): Payer: BC Managed Care – PPO

## 2022-12-06 DIAGNOSIS — D3502 Benign neoplasm of left adrenal gland: Secondary | ICD-10-CM | POA: Diagnosis not present

## 2022-12-06 LAB — BASIC METABOLIC PANEL
BUN: 11 mg/dL (ref 6–23)
CO2: 29 mEq/L (ref 19–32)
Calcium: 9.3 mg/dL (ref 8.4–10.5)
Chloride: 103 mEq/L (ref 96–112)
Creatinine, Ser: 0.92 mg/dL (ref 0.40–1.20)
GFR: 72.55 mL/min (ref >60.00)
Glucose, Bld: 94 mg/dL (ref 70–99)
Potassium: 3.6 mEq/L (ref 3.5–5.1)
Sodium: 141 mEq/L (ref 135–145)

## 2022-12-06 LAB — CORTISOL: Cortisol, Plasma: 5.9 ug/dL

## 2022-12-13 LAB — ALDOSTERONE + RENIN ACTIVITY W/ RATIO
ALDO / PRA Ratio: 60 Ratio — ABNORMAL HIGH (ref 0.9–28.9)
Aldosterone: 10.6 ng/dL (ref 0.0–30.0)
Aldosterone: 15 ng/dL
Renin Activity: 0.25 ng/mL/h (ref 0.25–5.82)

## 2022-12-13 LAB — ACTH: C206 ACTH: 11 pg/mL (ref 6–50)

## 2022-12-13 LAB — CORTISOL

## 2022-12-13 LAB — BASIC METABOLIC PANEL

## 2022-12-17 ENCOUNTER — Telehealth: Payer: Self-pay | Admitting: Internal Medicine

## 2022-12-17 ENCOUNTER — Encounter: Payer: Self-pay | Admitting: Radiology

## 2022-12-17 ENCOUNTER — Encounter: Payer: Self-pay | Admitting: Internal Medicine

## 2022-12-17 NOTE — Telephone Encounter (Signed)
Spoke to the patient on 12/17/2022 at 1300 regarding abnormal elevated Aldo/PRA ratio   We discussed this is nonspecific finding but we may proceed with confirmation to either saline loading versus oral salt loading  Patient prefers to do saline loading, will contact infusion switched to schedule 8 AM, fasting appointment  Protocol will be faxed to them

## 2022-12-17 NOTE — Telephone Encounter (Signed)
Patient can not do the 10/10 date so she will contact the infusion center and schedule a date that works for her.

## 2022-12-17 NOTE — Telephone Encounter (Signed)
Patient was scheduled for CT scan of the abdomen at [facility], but she prefers to have her imaging done in [place]   Please proceed with we authorizing for [facility] location   Thank you

## 2022-12-18 ENCOUNTER — Telehealth: Payer: Self-pay

## 2022-12-18 LAB — SALIVARY CORTISOL X2, TIMED
Salivary Cortisol 2nd Specimen: 0.027 ug/dL
Salivary Cortisol Baseline: 0.073 ug/dL

## 2022-12-18 NOTE — Telephone Encounter (Signed)
Prior auth not needed per infusion center

## 2022-12-18 NOTE — Telephone Encounter (Signed)
[facility] needs prior auth sent over for the saline loading infusion.  [phone] fax phone [phone]

## 2022-12-20 ENCOUNTER — Ambulatory Visit
Admission: RE | Admit: 2022-12-20 | Discharge: 2022-12-20 | Disposition: A | Discharge status: Home / Self Care | Payer: BC Managed Care – PPO | Source: Ambulatory Visit | Attending: Internal Medicine | Admitting: Internal Medicine

## 2022-12-20 DIAGNOSIS — D3502 Benign neoplasm of left adrenal gland: Secondary | ICD-10-CM

## 2022-12-20 MED ORDER — IOPAMIDOL (ISOVUE-300) INJECTION 61%
100.0000 mL | Freq: Once | INTRAVENOUS | Status: AC | PRN
  Administered 2022-12-20: 100 mL via INTRAVENOUS

## 2022-12-27 ENCOUNTER — Encounter (HOSPITAL_COMMUNITY): Payer: BC Managed Care – PPO

## 2023-01-01 DIAGNOSIS — I87393 Chronic venous hypertension (idiopathic) with other complications of bilateral lower extremity: Secondary | ICD-10-CM | POA: Diagnosis not present

## 2023-01-02 ENCOUNTER — Other Ambulatory Visit (HOSPITAL_COMMUNITY): Payer: Self-pay | Admitting: *Deleted

## 2023-01-02 DIAGNOSIS — D3502 Benign neoplasm of left adrenal gland: Secondary | ICD-10-CM

## 2023-01-03 ENCOUNTER — Ambulatory Visit (HOSPITAL_COMMUNITY)
Admission: RE | Admit: 2023-01-03 | Discharge: 2023-01-03 | Disposition: A | Discharge status: Home / Self Care | Payer: BC Managed Care – PPO | Source: Ambulatory Visit | Attending: Internal Medicine | Admitting: Internal Medicine

## 2023-01-03 VITALS — BP 120/90 | HR 63 | Temp 98.3°F | Resp 16 | Wt 313.7 lb

## 2023-01-03 DIAGNOSIS — I10 Essential (primary) hypertension: Secondary | ICD-10-CM | POA: Diagnosis not present

## 2023-01-03 DIAGNOSIS — D3502 Benign neoplasm of left adrenal gland: Secondary | ICD-10-CM | POA: Diagnosis not present

## 2023-01-03 LAB — CORTISOL
Cortisol, Plasma: 10.6 ug/dL
Cortisol, Plasma: 2.7 ug/dL

## 2023-01-03 MED ORDER — CLONIDINE HCL 0.1 MG PO TABS
ORAL_TABLET | ORAL | Status: AC
  Administered 2023-01-03: 0.1 mg
  Filled 2023-01-03: qty 1

## 2023-01-03 MED ORDER — SODIUM CHLORIDE 0.9 % IV SOLN: Freq: Once | INTRAVENOUS | Status: AC

## 2023-01-03 MED ORDER — CLONIDINE HCL 0.1 MG PO TABS
0.1000 mg | ORAL_TABLET | ORAL | Status: AC | PRN
  Administered 2023-01-03 (×2): 0.1 mg via ORAL

## 2023-01-03 MED ORDER — CLONIDINE HCL 0.1 MG PO TABS
ORAL_TABLET | ORAL | Status: AC
  Filled 2023-01-03: qty 1

## 2023-01-07 LAB — ALDOSTERONE
Aldosterone: 17.8 ng/dL (ref 0.0–30.0)
Aldosterone: 8.1 ng/dL (ref 0.0–30.0)

## 2023-01-21 ENCOUNTER — Encounter: Payer: Self-pay | Admitting: Internal Medicine

## 2023-02-13 ENCOUNTER — Other Ambulatory Visit: Payer: Self-pay | Admitting: Nurse Practitioner

## 2023-02-13 ENCOUNTER — Encounter: Payer: Self-pay | Admitting: Nurse Practitioner

## 2023-02-13 MED ORDER — SEMAGLUTIDE-WEIGHT MANAGEMENT 1.7 MG/0.75ML ~~LOC~~ SOAJ
1.7000 mg | SUBCUTANEOUS | 0 refills | Status: DC
Start: 2023-05-11 — End: 2023-05-14

## 2023-02-13 MED ORDER — SEMAGLUTIDE-WEIGHT MANAGEMENT 0.5 MG/0.5ML ~~LOC~~ SOAJ: 0.5000 mg | SUBCUTANEOUS | 0 refills | Status: AC

## 2023-02-13 MED ORDER — SEMAGLUTIDE-WEIGHT MANAGEMENT 0.25 MG/0.5ML ~~LOC~~ SOAJ
0.2500 mg | SUBCUTANEOUS | 0 refills | Status: AC
Start: 2023-02-13 — End: 2023-03-13

## 2023-02-13 MED ORDER — SEMAGLUTIDE-WEIGHT MANAGEMENT 2.4 MG/0.75ML ~~LOC~~ SOAJ
2.4000 mg | SUBCUTANEOUS | 0 refills | Status: DC
Start: 2023-06-09 — End: 2023-06-10

## 2023-02-13 MED ORDER — SEMAGLUTIDE-WEIGHT MANAGEMENT 1 MG/0.5ML ~~LOC~~ SOAJ
1.0000 mg | SUBCUTANEOUS | 0 refills | Status: AC
Start: 2023-04-12 — End: 2023-05-10

## 2023-02-13 NOTE — Telephone Encounter (Signed)
Please advise, do she need another appointment or will you prescribe again

## 2023-03-22 ENCOUNTER — Other Ambulatory Visit (HOSPITAL_BASED_OUTPATIENT_CLINIC_OR_DEPARTMENT_OTHER): Payer: Self-pay

## 2023-03-26 ENCOUNTER — Ambulatory Visit
Admission: EM | Admit: 2023-03-26 | Discharge: 2023-03-26 | Disposition: A | Discharge status: Home / Self Care | Payer: BC Managed Care – PPO | Attending: Emergency Medicine | Admitting: Emergency Medicine

## 2023-03-26 DIAGNOSIS — M5431 Sciatica, right side: Secondary | ICD-10-CM | POA: Diagnosis not present

## 2023-03-26 DIAGNOSIS — R29898 Other symptoms and signs involving the musculoskeletal system: Secondary | ICD-10-CM

## 2023-03-26 MED ORDER — IBUPROFEN 600 MG PO TABS: 600.0000 mg | ORAL_TABLET | Freq: Four times a day (QID) | ORAL | 0 refills | Status: DC | PRN

## 2023-03-26 MED ORDER — CYCLOBENZAPRINE HCL 10 MG PO TABS: 10.0000 mg | ORAL_TABLET | Freq: Two times a day (BID) | ORAL | 0 refills | Status: DC | PRN

## 2023-03-26 NOTE — ED Triage Notes (Signed)
 Patient to Urgent Care with complaints of right sided gluteal pain, occasionally radiates into her thigh.   Symptoms started five days ago. Initial pain started when she was walking up stairs. Denies hx of the same.   Using heat/ ice/ extra strength tylenol  x1 dose.

## 2023-03-26 NOTE — Discharge Instructions (Addendum)
 Take ibuprofen as needed for discomfort.  Take the muscle relaxer as needed for muscle spasm; Do not drive, operate machinery, or drink alcohol with this medication as it can cause drowsiness.   Follow up with your primary care provider or an orthopedist.  Go to the emergency department if you have worsening symptoms.

## 2023-03-26 NOTE — ED Provider Notes (Signed)
 [facility]    CSN: [id number] Arrival date & time: 03/26/23  1122      History   Chief Complaint Chief Complaint  Patient presents with   Back Pain    HPI Ann Morgan is a 53 y.o. female.  Patient presents with 5-6 day history of pain in her right buttock that occasionally radiates to her right hip and anterior thigh.  No falls or injury.  The pain started when she was walking up stairs.  It waxes and wanes but never completely resolves.  No aggravating or alleviating factors.  She reports weakness in her right leg today.  She denies numbness, saddle anesthesia, loss of bowel/bladder control, abdominal pain, dysuria, or other symptoms.  No OTC medication taken today.  The history is provided by the patient and medical records.    Past Medical History:  Diagnosis Date   Abnormal Pap smear of cervix    Asthma    Endometrial hyperplasia    Hypertension 2004   Irregular menses    Mild intermittent asthma with acute exacerbation 10/19/2013   Late 2013 with a cough that lasted approximately one month.  PFTs showed FEV1 of 83%  Last Assessment & Plan:  Reports that since she was prescribed albuterol, she has used it maybe 1x to see how she would respond to it which seemed to help. However, she still has some coughing issues but thinks it is manageable. No issues with high pollen season.   Plan: - Continue with albuterol PRN.  - She will   Obesity     Patient Active Problem List   Diagnosis Date Noted   Meralgia paraesthetica, right 11/30/2022   Weight gain 11/30/2022   Adenoma of left adrenal gland 11/30/2022   Mixed hyperlipidemia 11/09/2022   Meralgia paresthetica of right side 11/09/2022   Adrenal nodule (HCC) 11/09/2021   Abdominal pain, left lower quadrant 08/31/2021   Left ovarian cyst 08/31/2021   Ovary, ovarian pedicle and fallopian tube torsion 08/31/2021   Abdominal pain 08/23/2021   Disorder of bursae of shoulder region 05/12/2021   Knee pain  05/12/2021   Tear of lateral cartilage or meniscus of knee, current 05/12/2021   Pain in joint of left knee 09/12/2020   Complications affecting other specified body systems, hypertension 09/02/2020   Muscle weakness 09/02/2020   Traumatic complete tear of right rotator cuff 04/10/2019   Epidermal cyst of neck 02/17/2018   Skin abnormalities 07/15/2017   Endometrial hyperplasia without atypia, simple 05/17/2017   Patellofemoral pain syndrome of both knees 02/04/2017   Vasovagal syncope 02/04/2017   Glucose intolerance 01/01/2017   Overactive bladder 01/01/2017   Glucose intolerance 01/01/2017   Body mass index (BMI) of 45.0-49.9 in adult [facility]) 09/19/2015   Injury of left wrist 05/09/2015   Screening for STD (sexually transmitted disease) 04/06/2015   Varicose veins of both lower extremities with pain 10/27/2014   Mild intermittent asthma with acute exacerbation 10/19/2013   Breast cyst 12/11/2012   Abnormal mammogram 06/09/2012   Premenopausal menorrhagia 04/28/2012   Polycystic ovaries 03/29/2010   Class 3 severe obesity due to excess calories in adult [facility]) 03/29/2010   Essential hypertension 03/29/2010    Past Surgical History:  Procedure Laterality Date   BREAST CYST ASPIRATION Left 2015   benign   CYSTOSCOPY  08/31/2021   Procedure: CYSTOSCOPY;  Surgeon: Leonce Garnette BIRCH, MD;  Location: [facility] ORS;  Service: Gynecology;;   KNEE SURGERY Left jan 2011   orthoscopic   ROBOTIC  ASSISTED LAPAROSCOPIC LYSIS OF ADHESION  08/31/2021   Procedure: XI ROBOTIC ASSISTED LAPAROSCOPIC LYSIS OF ADHESION;  Surgeon: Leonce Garnette BIRCH, MD;  Location: [facility] ORS;  Service: Gynecology;;   ROBOTIC ASSISTED LAPAROSCOPIC OVARIAN CYSTECTOMY Left 08/31/2021   Procedure: XI ROBOTIC ASSISTED LAPAROSCOPIC LEFT SALPINGO OOPHORECTOMY CYSTECTOMY;  Surgeon: Leonce Garnette BIRCH, MD;  Location: [facility] ORS;  Service: Gynecology;  Laterality: Left;   SHOULDER ARTHROSCOPY WITH SUBACROMIAL DECOMPRESSION, ROTATOR CUFF  REPAIR AND BICEP TENDON REPAIR Right 12/25/2018   Procedure: right shoulder arthroscopy, biceps tendon release with tenodesis, three tendon rotator cuff repair;  Surgeon: Addie Cordella Hamilton, MD;  Location: [facility] OR;  Service: Orthopedics;  Laterality: Right;    OB History     Gravida  0   Para  0   Term  0   Preterm  0   AB  0   Living  0      SAB  0   IAB  0   Ectopic  0   Multiple  0   Live Births  0        Obstetric Comments  First menstrual at age 52          Home Medications    Prior to Admission medications   Medication Sig Start Date End Date Taking? Authorizing Provider  cyclobenzaprine  (FLEXERIL ) 10 MG tablet Take 1 tablet (10 mg total) by mouth 2 (two) times daily as needed for muscle spasms. 03/26/23  Yes Corlis Burnard DEL, NP  ibuprofen  (ADVIL ) 600 MG tablet Take 1 tablet (600 mg total) by mouth every 6 (six) hours as needed. 03/26/23  Yes Corlis Burnard DEL, NP  Semaglutide -Weight Management 0.5 MG/0.5ML SOAJ Inject 0.5 mg into the skin once a week for 28 days. 03/14/23 04/11/23 Yes Pender, Julie F, FNP  Prenatal Vit-Fe Fumarate-FA (PRENATAL MULTIVITAMIN) TABS Take 1 tablet by mouth daily.     [provider]  Semaglutide -Weight Management 1 MG/0.5ML SOAJ Inject 1 mg into the skin once a week for 28 days. 04/12/23 05/10/23  Gareth Mliss FALCON, FNP  Semaglutide -Weight Management 1.7 MG/0.75ML SOAJ Inject 1.7 mg into the skin once a week for 28 days. 05/11/23 06/08/23  Gareth Mliss FALCON, FNP  Semaglutide -Weight Management 2.4 MG/0.75ML SOAJ Inject 2.4 mg into the skin once a week for 28 days. 06/09/23 07/07/23  Pender, Julie F, FNP  triamterene -hydrochlorothiazide (DYAZIDE) 37.5-25 MG capsule Take 1 each (1 capsule total) by mouth every morning. 09/18/22   Gareth Mliss FALCON, FNP    Family History Family History  Problem Relation Age of Onset   Hypertension Mother    Diabetes Brother    Hypertension Brother    Breast cancer Maternal Grandmother 30    Social  History Social History   Tobacco Use   Smoking status: Never   Smokeless tobacco: Never  Vaping Use   Vaping status: Never Used  Substance Use Topics   Alcohol use: No   Drug use: No     Allergies   Patient has no known allergies.   Review of Systems Review of Systems  Constitutional:  Negative for chills and fever.  Gastrointestinal:  Negative for abdominal pain, constipation, diarrhea, nausea and vomiting.  Genitourinary:  Negative for dysuria and hematuria.  Musculoskeletal:  Positive for arthralgias, back pain and gait problem. Negative for joint swelling.  Skin:  Negative for color change, rash and wound.  Neurological:  Positive for weakness. Negative for numbness.     Physical Exam Triage Vital Signs ED Triage Vitals  Encounter Vitals Group     BP --      Systolic BP Percentile --      Diastolic BP Percentile --      Pulse Rate 03/26/23 1136 88     Resp 03/26/23 1136 18     Temp 03/26/23 1136 97.9 F (36.6 C)     Temp src --      SpO2 03/26/23 1136 97 %     Weight --      Height --      Head Circumference --      Peak Flow --      Pain Score 03/26/23 1139 7     Pain Loc --      Pain Education --      Exclude from Growth Chart --    No data found.  Updated Vital Signs BP (!) 144/88   Pulse 88   Temp 97.9 F (36.6 C)   Resp 18   LMP 11/17/2021   SpO2 97%   Visual Acuity Right Eye Distance:   Left Eye Distance:   Bilateral Distance:    Right Eye Near:   Left Eye Near:    Bilateral Near:     Physical Exam Constitutional:      General: She is not in acute distress. HENT:     Mouth/Throat:     Mouth: Mucous membranes are moist.  Cardiovascular:     Rate and Rhythm: Normal rate and regular rhythm.  Pulmonary:     Effort: Pulmonary effort is normal. No respiratory distress.  Abdominal:     General: Bowel sounds are normal.     Palpations: Abdomen is soft.     Tenderness: There is no abdominal tenderness. There is no guarding or  rebound.  Musculoskeletal:        General: No swelling, tenderness or deformity. Normal range of motion.  Skin:    General: Skin is warm and dry.     Capillary Refill: Capillary refill takes less than 2 seconds.     Findings: No bruising, erythema, lesion or rash.  Neurological:     General: No focal deficit present.     Mental Status: She is alert and oriented to person, place, and time.     Sensory: No sensory deficit.     Motor: Weakness present.     Gait: Gait abnormal.     Comments: Limping gait.  Weaker ability to lift her right leg while in lying position but still active ROM and sensation intact.        UC Treatments / Results  Labs (all labs ordered are listed, but only abnormal results are displayed) Labs Reviewed - No data to display  EKG   Radiology No results found.  Procedures Procedures (including critical care time)  Medications Ordered in UC Medications - No data to display  Initial Impression / Assessment and Plan / UC Course  I have reviewed the triage vital signs and the nursing notes.  Pertinent labs & imaging results that were available during my care of the patient were reviewed by me and considered in my medical decision making (see chart for details).    Right side sciatica, weakness of right leg.  Treating today with ibuprofen  and Flexeril .  Precautions for drowsiness with Flexeril  discussed.  Education provided on sciatica.  Instructed patient to follow-up with her PCP or an orthopedist as soon as possible.  ED precautions given, including for increased weakness or new concerning symptoms.  Patient agrees  to plan of care.  Final Clinical Impressions(s) / UC Diagnoses   Final diagnoses:  Sciatica of right side  Weakness of right leg     Discharge Instructions      Take ibuprofen  as needed for discomfort.  Take the muscle relaxer as needed for muscle spasm; Do not drive, operate machinery, or drink alcohol with this medication as it can  cause drowsiness.   Follow up with your primary care provider or an orthopedist.  Go to the emergency department if you have worsening symptoms.       ED Prescriptions     Medication Sig Dispense Auth. Provider   ibuprofen  (ADVIL ) 600 MG tablet Take 1 tablet (600 mg total) by mouth every 6 (six) hours as needed. 30 tablet Corlis Burnard DEL, NP   cyclobenzaprine  (FLEXERIL ) 10 MG tablet Take 1 tablet (10 mg total) by mouth 2 (two) times daily as needed for muscle spasms. 20 tablet Corlis Burnard DEL, NP      I have reviewed the PDMP during this encounter.   Corlis Burnard DEL, NP 03/26/23 1214

## 2023-04-03 ENCOUNTER — Other Ambulatory Visit: Payer: Self-pay | Admitting: Obstetrics and Gynecology

## 2023-04-03 DIAGNOSIS — Z1231 Encounter for screening mammogram for malignant neoplasm of breast: Secondary | ICD-10-CM

## 2023-04-05 ENCOUNTER — Encounter: Payer: Self-pay | Admitting: Nurse Practitioner

## 2023-04-05 ENCOUNTER — Telehealth: Payer: Self-pay | Admitting: Nurse Practitioner

## 2023-04-05 MED ORDER — WEGOVY 1 MG/0.5ML ~~LOC~~ SOAJ: 1.0000 mg | SUBCUTANEOUS | 0 refills | Status: DC

## 2023-04-05 NOTE — Telephone Encounter (Signed)
Caller states pharmacy advised her to call PCP and request PCP to send in a new script for [facility] 1mg .     [facility] [address] [place], [place] [address] [phone]

## 2023-04-08 ENCOUNTER — Other Ambulatory Visit: Payer: Self-pay | Admitting: Nurse Practitioner

## 2023-04-10 DIAGNOSIS — L299 Pruritus, unspecified: Secondary | ICD-10-CM | POA: Diagnosis not present

## 2023-04-10 DIAGNOSIS — I87393 Chronic venous hypertension (idiopathic) with other complications of bilateral lower extremity: Secondary | ICD-10-CM | POA: Diagnosis not present

## 2023-04-10 DIAGNOSIS — I872 Venous insufficiency (chronic) (peripheral): Secondary | ICD-10-CM | POA: Diagnosis not present

## 2023-04-10 DIAGNOSIS — I10 Essential (primary) hypertension: Secondary | ICD-10-CM | POA: Diagnosis not present

## 2023-04-29 ENCOUNTER — Ambulatory Visit
Admission: RE | Admit: 2023-04-29 | Discharge: 2023-04-29 | Disposition: A | Discharge status: Home / Self Care | Payer: BC Managed Care – PPO | Source: Ambulatory Visit | Attending: Obstetrics and Gynecology | Admitting: Obstetrics and Gynecology

## 2023-04-29 DIAGNOSIS — Z1231 Encounter for screening mammogram for malignant neoplasm of breast: Secondary | ICD-10-CM | POA: Diagnosis not present

## 2023-04-29 DIAGNOSIS — Z01419 Encounter for gynecological examination (general) (routine) without abnormal findings: Secondary | ICD-10-CM | POA: Diagnosis not present

## 2023-05-07 ENCOUNTER — Encounter: Payer: Self-pay | Admitting: Nurse Practitioner

## 2023-05-14 ENCOUNTER — Other Ambulatory Visit: Payer: Self-pay

## 2023-05-14 MED ORDER — SEMAGLUTIDE-WEIGHT MANAGEMENT 1.7 MG/0.75ML ~~LOC~~ SOAJ: 1.7000 mg | SUBCUTANEOUS | 0 refills | Status: AC

## 2023-05-16 NOTE — Progress Notes (Unsigned)
   LMP 11/17/2021    Subjective:    Patient ID: Ann Morgan, female    DOB: [birth year], 53 y.o.   MRN: [id number]  HPI: Ann Morgan is a 53 y.o. female  No chief complaint on file.   Discussed the use of AI scribe software for clinical note transcription with the patient, who gave verbal consent to proceed.  History of Present Illness           11/09/2022    8:45 AM 05/11/2022    8:55 AM 11/09/2021    8:31 AM  Depression screen PHQ 2/9  Decreased Interest 0 0 0  Down, Depressed, Hopeless 0 0 0  PHQ - 2 Score 0 0 0    Relevant past medical, surgical, family and social history reviewed and updated as indicated. Interim medical history since our last visit reviewed. Allergies and medications reviewed and updated.  Review of Systems  Per HPI unless specifically indicated above     Objective:    LMP 11/17/2021   {Vitals History (Optional):23777} Wt Readings from Last 3 Encounters:  01/03/23 (!) 313 lb 11.2 oz (142.3 kg)  11/30/22 (!) 314 lb (142.4 kg)  11/09/22 (!) 311 lb 3.2 oz (141.2 kg)    Physical Exam  Results for orders placed or performed during the hospital encounter of 01/03/23  Aldosterone   Collection Time: 01/03/23  8:31 AM  Result Value Ref Range   Aldosterone 17.8 0.0 - 30.0 ng/dL  Cortisol, Random   Collection Time: 01/03/23  8:31 AM  Result Value Ref Range   Cortisol, Plasma 10.6 ug/dL  Cortisol, Random   Collection Time: 01/03/23  1:04 PM  Result Value Ref Range   Cortisol, Plasma 2.7 ug/dL  Aldosterone   Collection Time: 01/03/23  1:04 PM  Result Value Ref Range   Aldosterone 8.1 0.0 - 30.0 ng/dL   {Labs ([id number]):47829}    Assessment & Plan:   Problem List Items Addressed This Visit   None    Assessment and Plan             Follow up plan: No follow-ups on file.

## 2023-05-17 ENCOUNTER — Ambulatory Visit: Payer: BC Managed Care – PPO | Admitting: Nurse Practitioner

## 2023-05-17 ENCOUNTER — Encounter: Payer: Self-pay | Admitting: Nurse Practitioner

## 2023-05-17 VITALS — BP 124/68 | HR 95 | Temp 97.8°F | Resp 18 | Ht 69.0 in | Wt 300.8 lb

## 2023-05-17 DIAGNOSIS — I10 Essential (primary) hypertension: Secondary | ICD-10-CM | POA: Diagnosis not present

## 2023-05-17 DIAGNOSIS — Z6841 Body Mass Index (BMI) 40.0 and over, adult: Secondary | ICD-10-CM

## 2023-05-17 DIAGNOSIS — J4520 Mild intermittent asthma, uncomplicated: Secondary | ICD-10-CM | POA: Diagnosis not present

## 2023-05-17 DIAGNOSIS — Z131 Encounter for screening for diabetes mellitus: Secondary | ICD-10-CM

## 2023-05-18 LAB — COMPREHENSIVE METABOLIC PANEL
AG Ratio: 1.4 (calc) (ref 1.0–2.5)
ALT: 16 U/L (ref 6–29)
AST: 21 U/L (ref 10–35)
Albumin: 4.4 g/dL (ref 3.6–5.1)
Alkaline phosphatase (APISO): 89 U/L (ref 37–153)
BUN: 14 mg/dL (ref 7–25)
CO2: 33 mmol/L — ABNORMAL HIGH (ref 20–32)
Calcium: 9.8 mg/dL (ref 8.6–10.4)
Chloride: 100 mmol/L (ref 98–110)
Creat: 0.99 mg/dL (ref 0.50–1.03)
Globulin: 3.1 g/dL (ref 1.9–3.7)
Glucose, Bld: 78 mg/dL (ref 65–99)
Potassium: 3.5 mmol/L (ref 3.5–5.3)
Sodium: 140 mmol/L (ref 135–146)
Total Bilirubin: 0.5 mg/dL (ref 0.2–1.2)
Total Protein: 7.5 g/dL (ref 6.1–8.1)

## 2023-05-18 LAB — CBC WITH DIFFERENTIAL/PLATELET
Absolute Lymphocytes: 1516 {cells}/uL (ref 850–3900)
Absolute Monocytes: 456 {cells}/uL (ref 200–950)
Basophils Absolute: 32 {cells}/uL (ref 0–200)
Basophils Relative: 0.6 %
Eosinophils Absolute: 148 {cells}/uL (ref 15–500)
Eosinophils Relative: 2.8 %
HCT: 41.7 % (ref 35.0–45.0)
Hemoglobin: 13.5 g/dL (ref 11.7–15.5)
MCH: 29 pg (ref 27.0–33.0)
MCHC: 32.4 g/dL (ref 32.0–36.0)
MCV: 89.7 fL (ref 80.0–100.0)
MPV: 9.5 fL (ref 7.5–12.5)
Monocytes Relative: 8.6 %
Neutro Abs: 3148 {cells}/uL (ref 1500–7800)
Neutrophils Relative %: 59.4 %
Platelets: 339 10*3/uL (ref 140–400)
RBC: 4.65 10*6/uL (ref 3.80–5.10)
RDW: 13.4 % (ref 11.0–15.0)
Total Lymphocyte: 28.6 %
WBC: 5.3 10*3/uL (ref 3.8–10.8)

## 2023-05-18 LAB — HEMOGLOBIN A1C
Hgb A1c MFr Bld: 5.7 %{Hb} — ABNORMAL HIGH (ref <5.7)
Mean Plasma Glucose: 117 mg/dL
eAG (mmol/L): 6.5 mmol/L

## 2023-05-20 ENCOUNTER — Encounter: Payer: Self-pay | Admitting: Nurse Practitioner

## 2023-06-08 ENCOUNTER — Other Ambulatory Visit: Payer: Self-pay | Admitting: Nurse Practitioner

## 2023-06-08 DIAGNOSIS — E66813 Obesity, class 3: Secondary | ICD-10-CM

## 2023-06-09 ENCOUNTER — Encounter: Payer: Self-pay | Admitting: Nurse Practitioner

## 2023-06-09 DIAGNOSIS — E66813 Obesity, class 3: Secondary | ICD-10-CM

## 2023-06-10 MED ORDER — SEMAGLUTIDE-WEIGHT MANAGEMENT 2.4 MG/0.75ML ~~LOC~~ SOAJ: 2.4000 mg | SUBCUTANEOUS | 0 refills | Status: AC

## 2023-06-10 NOTE — Telephone Encounter (Signed)
 Requested Prescriptions  Refused Prescriptions Disp Refills   WEGOVY 1.7 MG/0.75ML SOAJ [Pharmacy Med Name: WEGOVY 1.7 MG/0.75 ML PEN]      Sig: INJECT 1.7 MG INTO THE SKIN ONCE A WEEK FOR 28 DAYS.     Endocrinology:  Diabetes - GLP-1 Receptor Agonists - semaglutide Failed - 06/10/2023 12:49 PM      Failed - HBA1C in normal range and within 180 days    Hgb A1c MFr Bld  Date Value Ref Range Status  05/17/2023 5.7 (H) <5.7 % of total Hgb Final    Comment:    For someone without known diabetes, a hemoglobin  A1c value between 5.7% and 6.4% is consistent with prediabetes and should be confirmed with a  follow-up test. . For someone with known diabetes, a value <7% indicates that their diabetes is well controlled. A1c targets should be individualized based on duration of diabetes, age, comorbid conditions, and other considerations. . This assay result is consistent with an increased risk of diabetes. . Currently, no consensus exists regarding use of hemoglobin A1c for diagnosis of diabetes for children. .          Failed - Valid encounter within last 6 months    Recent Outpatient Visits           7 months ago Essential hypertension   [facility] Berniece Salines, FNP   1 year ago Essential hypertension   [facility] Berniece Salines, FNP   1 year ago Adrenal nodule [facility])   [facility] Berniece Salines, FNP   1 year ago Left lower quadrant abdominal pain   [facility] Berniece Salines, FNP   1 year ago Left lower quadrant abdominal pain   [facility] Caro Laroche, DO       Future Appointments             In 5 months Zane Herald Rudolpho Sevin, FNP [facility], [facility]            Passed - Cr in normal range and within 360 days    Creat  Date Value Ref Range Status  05/17/2023 0.99 0.50 - 1.03 mg/dL Final

## 2023-07-09 ENCOUNTER — Other Ambulatory Visit: Payer: Self-pay | Admitting: Nurse Practitioner

## 2023-07-09 ENCOUNTER — Encounter: Payer: Self-pay | Admitting: Nurse Practitioner

## 2023-07-09 DIAGNOSIS — E66813 Obesity, class 3: Secondary | ICD-10-CM

## 2023-07-09 MED ORDER — WEGOVY 2.4 MG/0.75ML ~~LOC~~ SOAJ
2.4000 mg | SUBCUTANEOUS | 5 refills | Status: DC
Start: 2023-07-09 — End: 2023-11-29

## 2023-07-09 NOTE — Telephone Encounter (Signed)
 Duplicate request, Rx ordered today. Requested Prescriptions  Pending Prescriptions Disp Refills   WEGOVY  2.4 MG/0.75ML SOAJ [Pharmacy Med Name: WEGOVY  2.4 MG/0.75 ML PEN]      Sig: INJECT 2.4 MG INTO THE SKIN ONCE A WEEK FOR 28 DAYS.     Endocrinology:  Diabetes - GLP-1 Receptor Agonists - semaglutide  Failed - 07/09/2023  4:10 PM      Failed - HBA1C in normal range and within 180 days    Hgb A1c MFr Bld  Date Value Ref Range Status  05/17/2023 5.7 (H) <5.7 % of total Hgb Final    Comment:    For someone without known diabetes, a hemoglobin  A1c value between 5.7% and 6.4% is consistent with prediabetes and should be confirmed with a  follow-up test. . For someone with known diabetes, a value <7% indicates that their diabetes is well controlled. A1c targets should be individualized based on duration of diabetes, age, comorbid conditions, and other considerations. . This assay result is consistent with an increased risk of diabetes. . Currently, no consensus exists regarding use of hemoglobin A1c for diagnosis of diabetes for children. .          Passed - Cr in normal range and within 360 days    Creat  Date Value Ref Range Status  05/17/2023 0.99 0.50 - 1.03 mg/dL Final         Passed - Valid encounter within last 6 months    Recent Outpatient Visits           1 month ago Essential hypertension   [facility] Quinton Buckler, FNP       Future Appointments             In 4 months Abram Hoguet, Monalisa Angles, FNP [facility], [facility]

## 2023-07-10 ENCOUNTER — Other Ambulatory Visit: Payer: Self-pay | Admitting: Nurse Practitioner

## 2023-07-10 DIAGNOSIS — I10 Essential (primary) hypertension: Secondary | ICD-10-CM

## 2023-07-10 NOTE — Telephone Encounter (Signed)
 Requested medication (s) are due for refill today:   Wegovy  is not covered by her plan.   Alternative being requested  Requested medication (s) are on the active medication list:   No  Future visit scheduled:   Yes 8/29.    LOV 05/17/2023   Last ordered: 07/09/2023 3 ml, 5 refills  This is not covered under her insurance.   Requesting an alternative.     Requested Prescriptions  Pending Prescriptions Disp Refills   WEGOVY  2.4 MG/0.75ML SOAJ [Pharmacy Med Name: WEGOVY  2.4 MG/0.75 ML PEN]  5    Sig: Inject 2.4 mg into the skin once a week.     Endocrinology:  Diabetes - GLP-1 Receptor Agonists - semaglutide  Failed - 07/10/2023 11:04 AM      Failed - HBA1C in normal range and within 180 days    Hgb A1c MFr Bld  Date Value Ref Range Status  05/17/2023 5.7 (H) <5.7 % of total Hgb Final    Comment:    For someone without known diabetes, a hemoglobin  A1c value between 5.7% and 6.4% is consistent with prediabetes and should be confirmed with a  follow-up test. . For someone with known diabetes, a value <7% indicates that their diabetes is well controlled. A1c targets should be individualized based on duration of diabetes, age, comorbid conditions, and other considerations. . This assay result is consistent with an increased risk of diabetes. . Currently, no consensus exists regarding use of hemoglobin A1c for diagnosis of diabetes for children. .          Passed - Cr in normal range and within 360 days    Creat  Date Value Ref Range Status  05/17/2023 0.99 0.50 - 1.03 mg/dL Final         Passed - Valid encounter within last 6 months    Recent Outpatient Visits           1 month ago Essential hypertension   [facility] Quinton Buckler, FNP       Future Appointments             In 4 months Abram Hoguet, Monalisa Angles, FNP [facility], [facility]

## 2023-07-11 NOTE — Telephone Encounter (Signed)
 Requested Prescriptions  Pending Prescriptions Disp Refills   triamterene -hydrochlorothiazide (DYAZIDE) 37.5-25 MG capsule [Pharmacy Med Name: TRIAMTERENE  37.5MG / HCTZ 25MG  CAPS] 90 capsule 1    Sig: TAKE 1 CAPSULE BY MOUTH EACH MORNING     Cardiovascular: Diuretic Combos Passed - 07/11/2023 11:28 AM      Passed - K in normal range and within 180 days    Potassium  Date Value Ref Range Status  05/17/2023 3.5 3.5 - 5.3 mmol/L Final         Passed - Na in normal range and within 180 days    Sodium  Date Value Ref Range Status  05/17/2023 140 135 - 146 mmol/L Final         Passed - Cr in normal range and within 180 days    Creat  Date Value Ref Range Status  05/17/2023 0.99 0.50 - 1.03 mg/dL Final         Passed - Last BP in normal range    BP Readings from Last 1 Encounters:  05/17/23 124/68         Passed - Valid encounter within last 6 months    Recent Outpatient Visits           1 month ago Essential hypertension   [facility] Quinton Buckler, FNP       Future Appointments             In 4 months Abram Hoguet, Monalisa Angles, FNP [facility], [facility]

## 2023-07-23 ENCOUNTER — Other Ambulatory Visit (HOSPITAL_COMMUNITY): Payer: Self-pay

## 2023-07-29 IMAGING — US US PELVIS COMPLETE TRANSABD/TRANSVAG W DUPLEX AND/OR DOPPLER
1 series · 13 of 25 positions shown · non-contrast
Comparison: None Available.

CLINICAL DATA: Left-sided pelvic pain for the past 2 days with
nausea and vomiting. History of PCOS.

EXAM:
TRANSABDOMINAL AND TRANSVAGINAL ULTRASOUND OF PELVIS
DOPPLER ULTRASOUND OF OVARIES
TECHNIQUE: Both transabdominal and transvaginal ultrasound examinations of the
pelvis were performed. Transabdominal technique was performed for
global imaging of the pelvis including uterus, ovaries, adnexal
regions, and pelvic cul-de-sac.
It was necessary to proceed with endovaginal exam following the
transabdominal exam to visualize the ovaries. Color and duplex
Doppler ultrasound was utilized to evaluate blood flow to the
ovaries.

[Series 1: us pelvic complete w transvaginal and torsion righ · 13 of 95 slices shown]
[im 1/95]
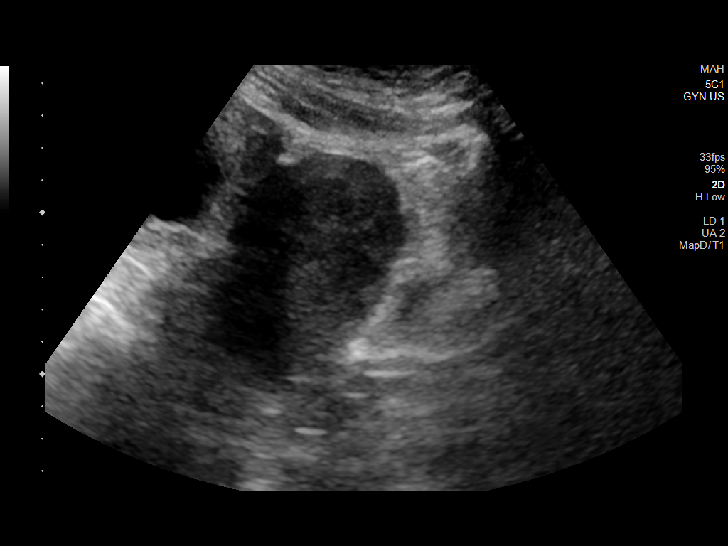
[im 8/95]
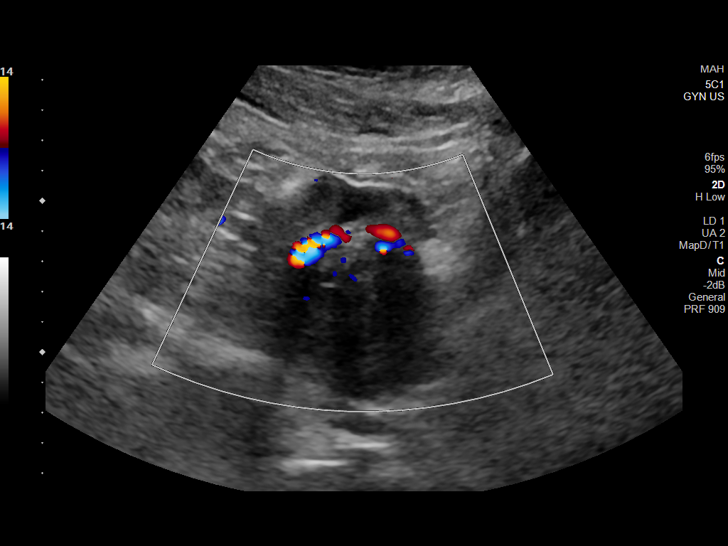
[im 16/95]
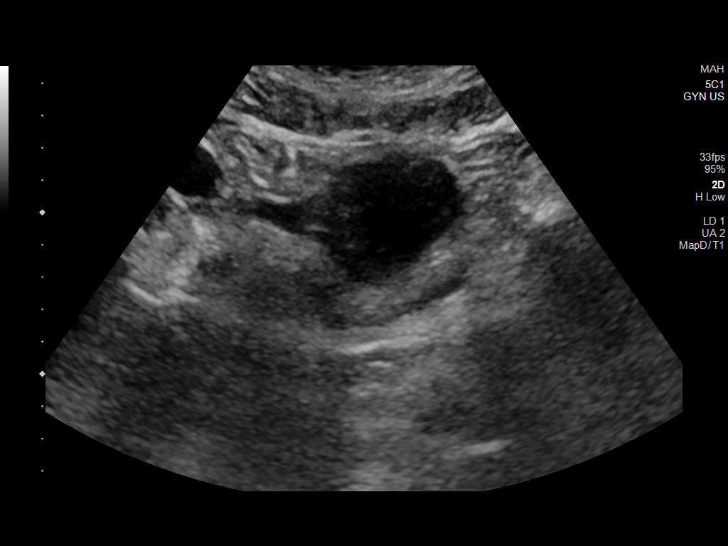
[im 24/95]
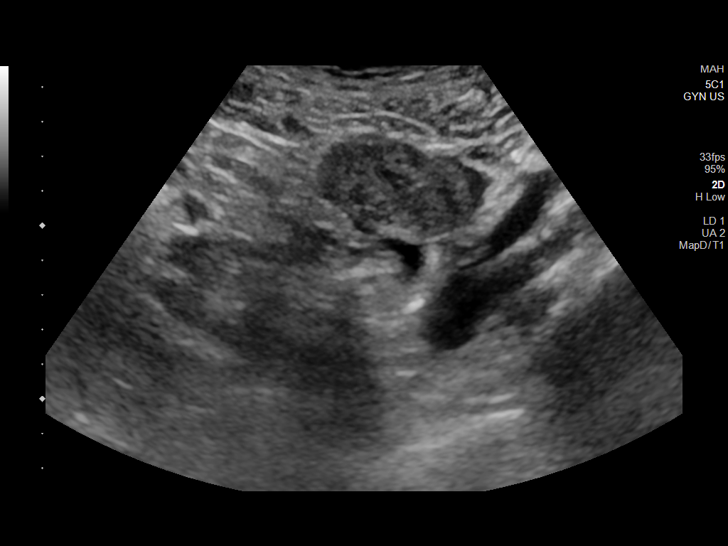
[im 32/95]
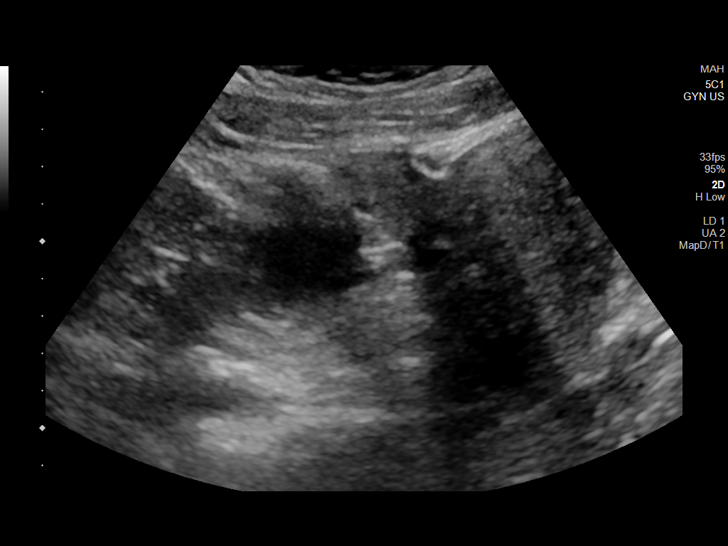
[im 40/95]
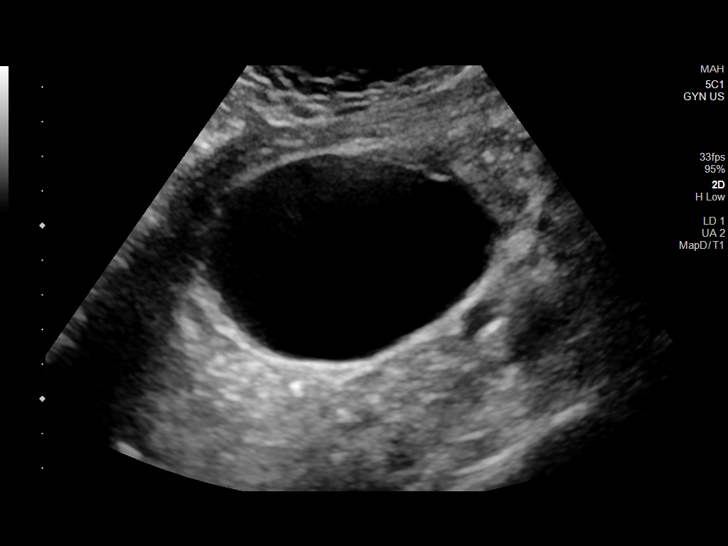
[im 48/95]
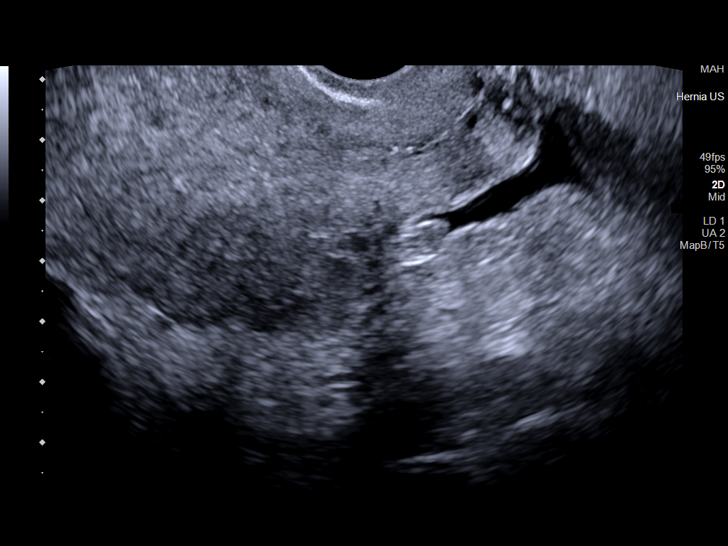
[im 55/95]
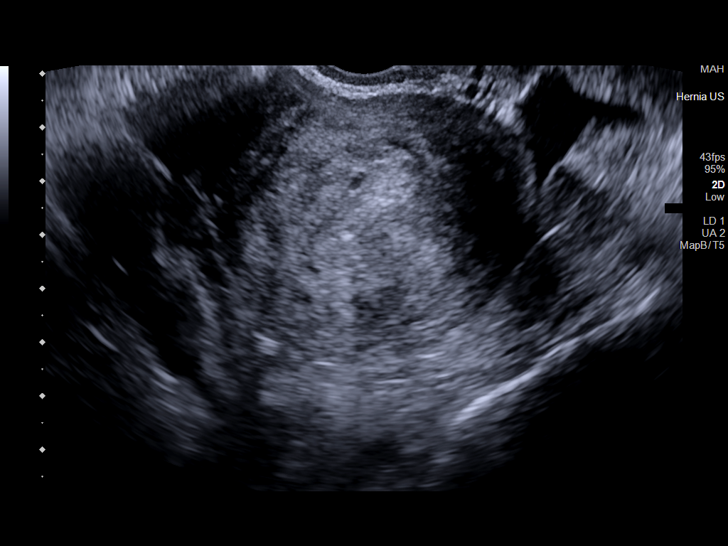
[im 63/95]
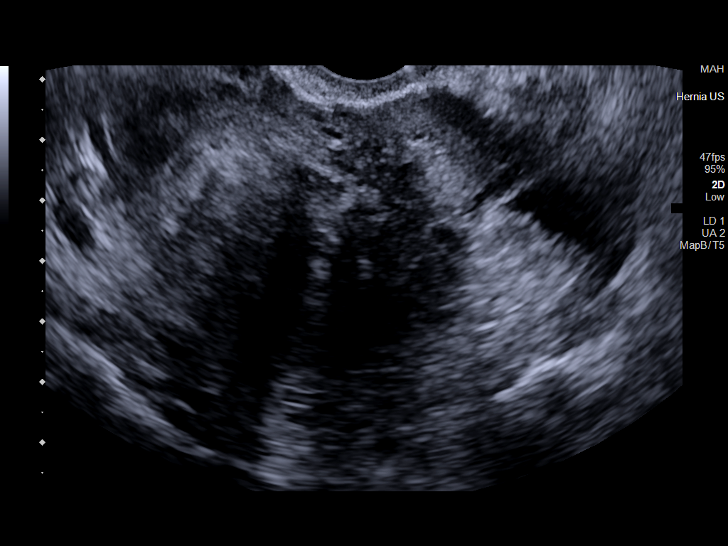
[im 71/95]
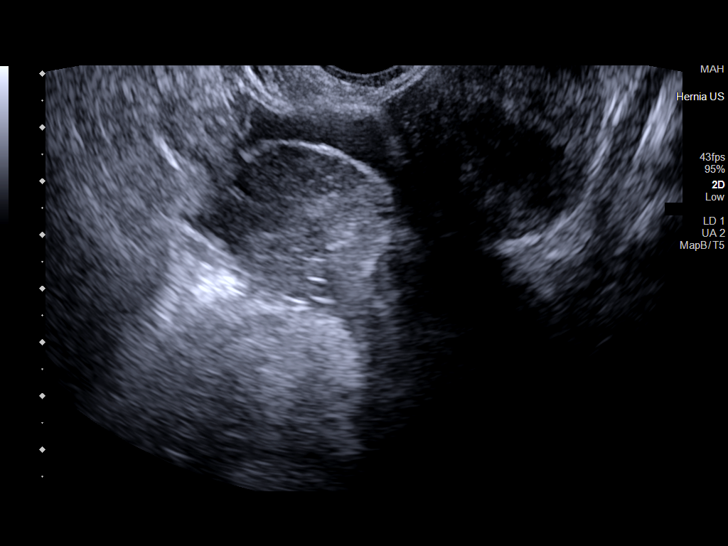
[im 79/95]
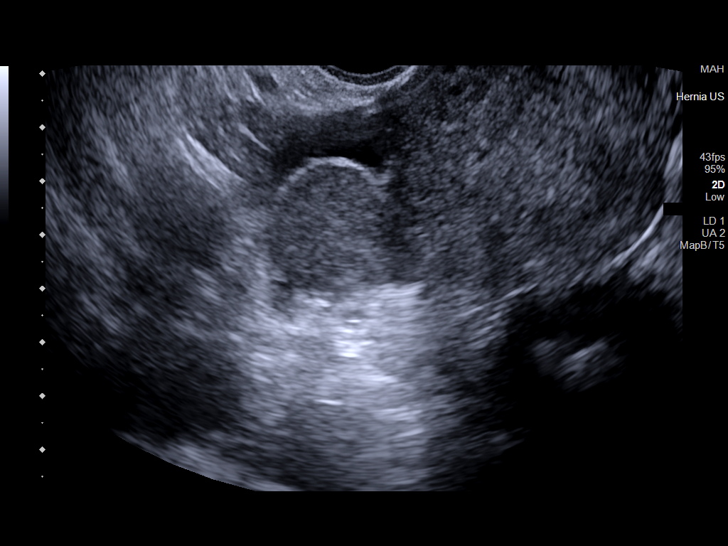
[im 87/95]
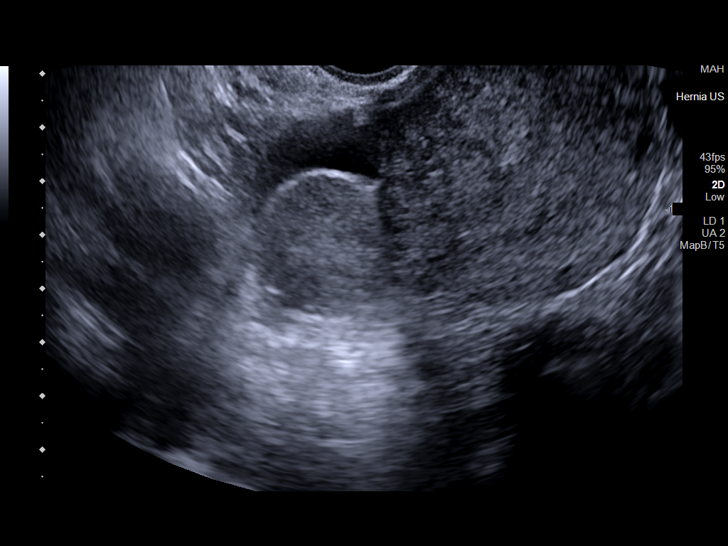
[im 95/95]
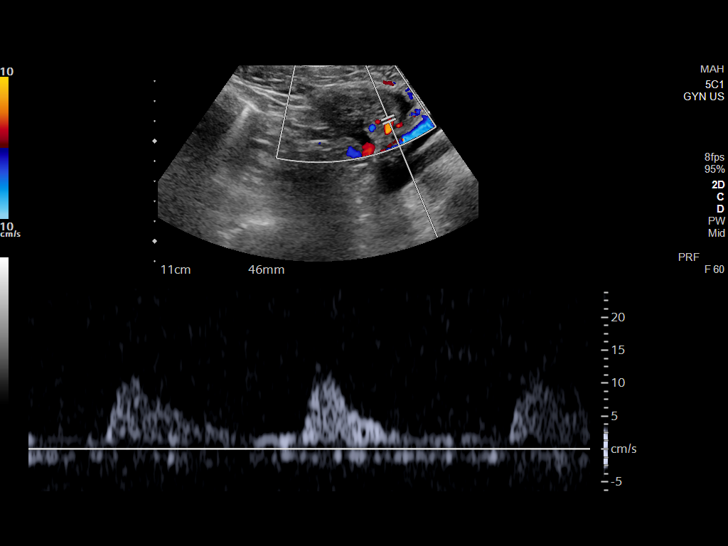

[13 of 25 positions shown; findings below may reference images not displayed]

FINDINGS: Uterus

Measurements: 3.1 x 5.4 x 6.2 cm = volume: 143 mL. Large 6.7 x 5.8 x
6.5 cm fibroid in the right body.

Endometrium

Thickness: 13 mm.  No focal abnormality visualized.

Right ovary

Measurements: 3.6 x 3.1 x 3.1 cm = volume: 18.3 mL. Normal
appearance/no adnexal mass.

Left ovary

Measurements: 4.7 x 2.6 x 2.8 cm = volume: 18.0 mL. 8.6 x 6.1 x
cm simple appearing left adnexal cyst.

Pulsed Doppler evaluation of both ovaries demonstrates normal
low-resistance arterial and venous waveforms in the left ovary.
Normal low resistance venous waveform in the right ovary with
difficulty obtaining a normal arterial waveform, although doing so
is technically difficult given the ovary location.

Other findings

Trace free fluid, likely physiologic.
IMPRESSION: 1. 8.6 cm simple appearing left adnexal cyst. Recommend follow-up US
in 3-6 months. Note: This recommendation does not apply to
premenarchal patients or to those with increased risk (genetic,
family history, elevated tumor markers or other high-risk factors)
of ovarian cancer. Reference: Radiology [DATE]):[id number].
2. Technically difficult arterial doppler evaluation of the right
ovary due to location, although favored to be within normal limits.
Consider repeat evaluation with short term follow up pelvic
ultrasound.
3. 6.7 cm uterine fibroid.

## 2023-07-29 IMAGING — CT CT ABD-PELV W/ CM
2 of 4 series · 16 of 46 positions shown, 18 images · IV contrast (APPLIED)
Comparison: 08/24/2021 pelvic ultrasound

CLINICAL DATA: Abdominal pain, nausea and vomiting for 2 days,
concern for diverticulitis

EXAM:
CT ABDOMEN AND PELVIS WITH CONTRAST
TECHNIQUE: Multidetector CT imaging of the abdomen and pelvis was performed
using the standard protocol following bolus administration of
intravenous contrast.

[Series 2: abd pel w · axial · 0.92mm/px · z∈[-505,-80]mm · 13 of 93 slices shown, 15 images]
[im 4/93  soft-tissue]
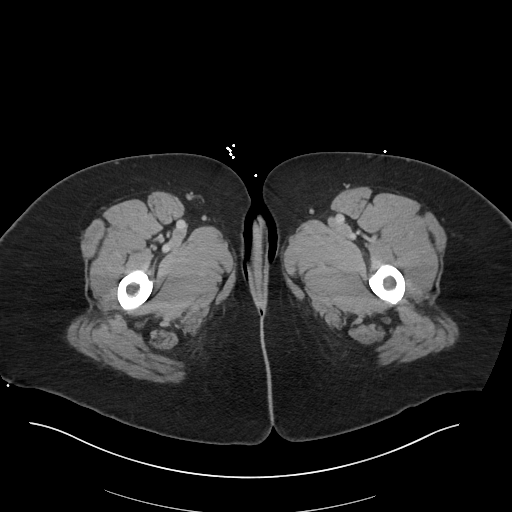
[im 4/93  bone]
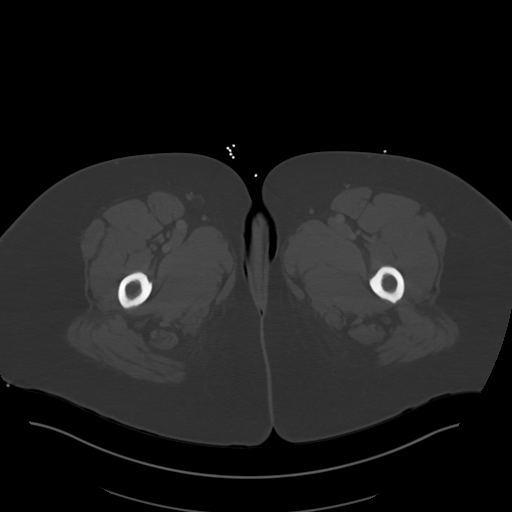
[im 11/93  soft-tissue]
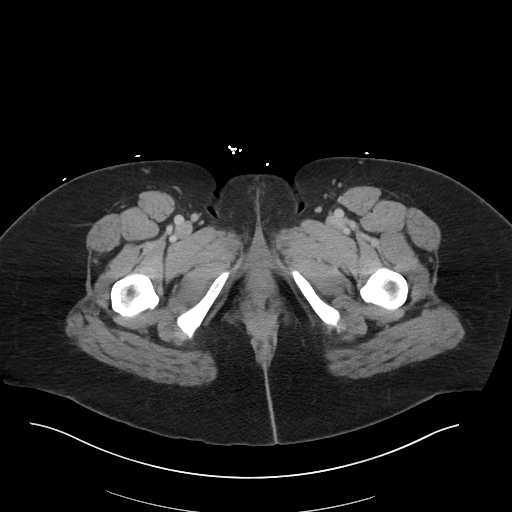
[im 18/93  soft-tissue]
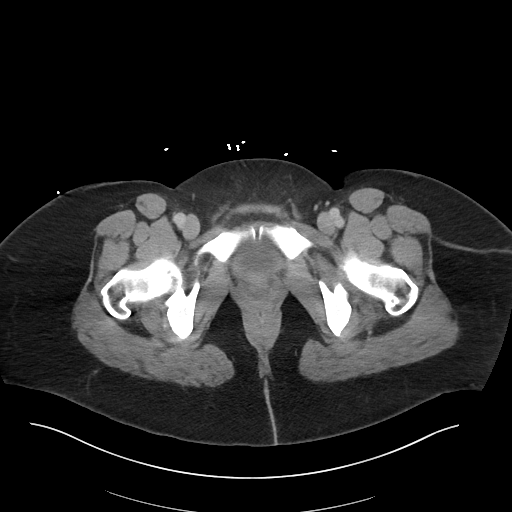
[im 25/93  soft-tissue]
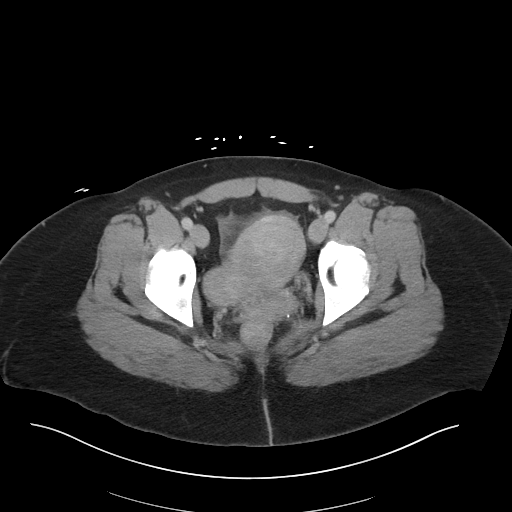
[im 32/93  soft-tissue]
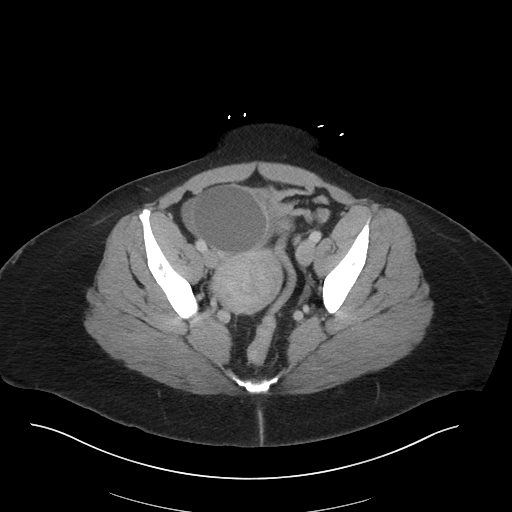
[im 39/93  soft-tissue]
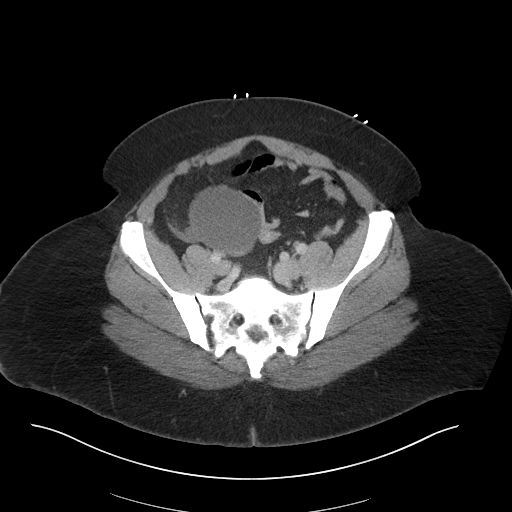
[im 47/93  soft-tissue]
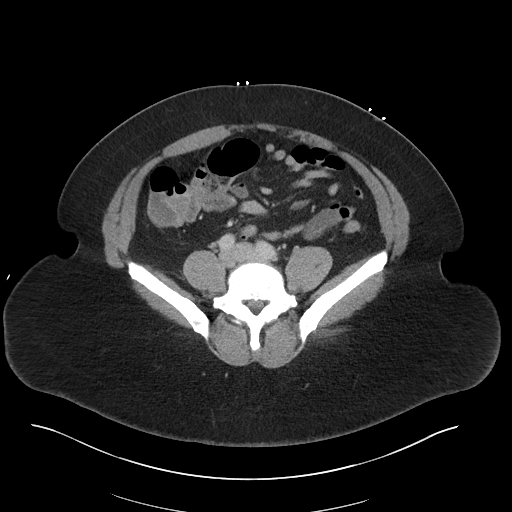
[im 54/93  soft-tissue]
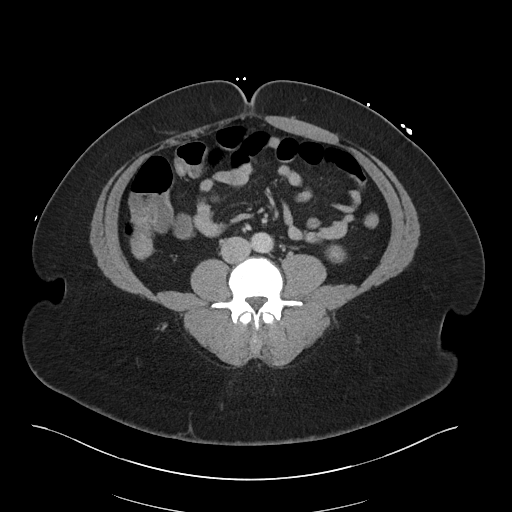
[im 61/93  soft-tissue]
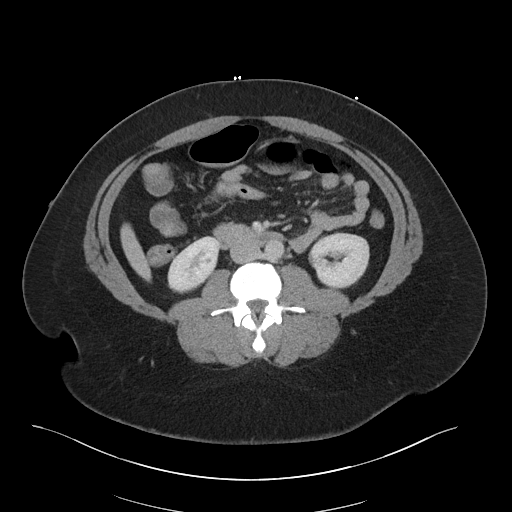
[im 61/93  bone]
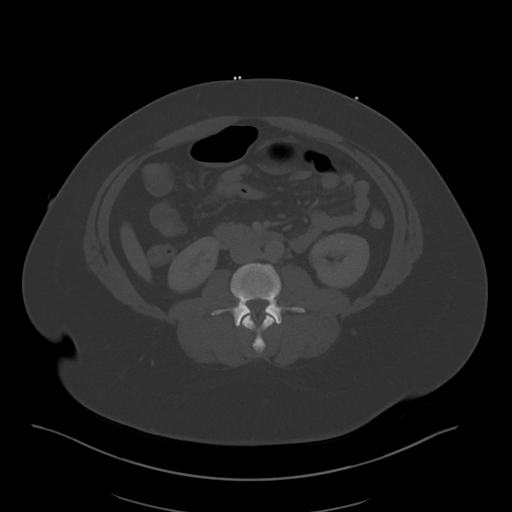
[im 68/93  soft-tissue]
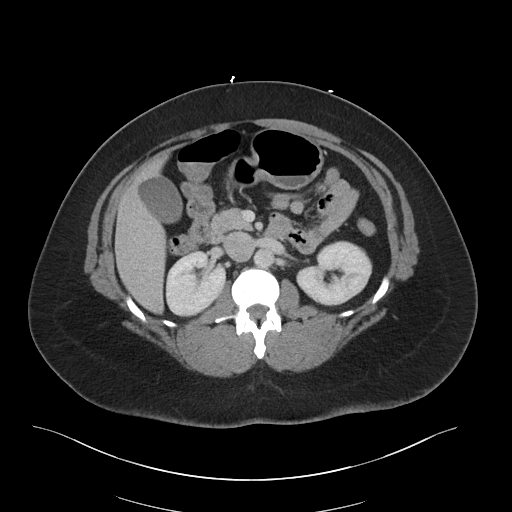
[im 75/93  soft-tissue]
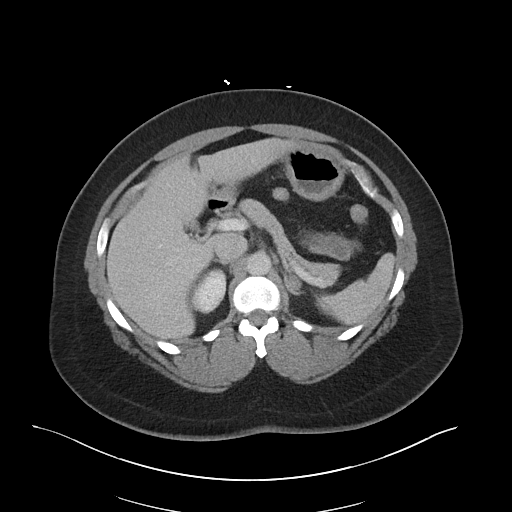
[im 82/93  soft-tissue]
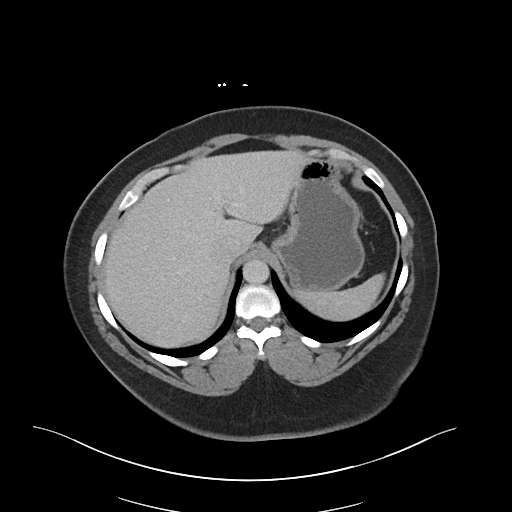
[im 89/93  soft-tissue]
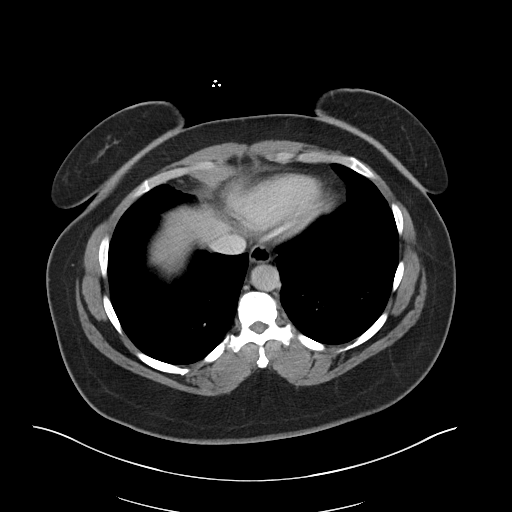

[Series 5: coronal · coronal · 0.85mm/px · 3 of 118 slices shown]
[im 40/118  soft-tissue]
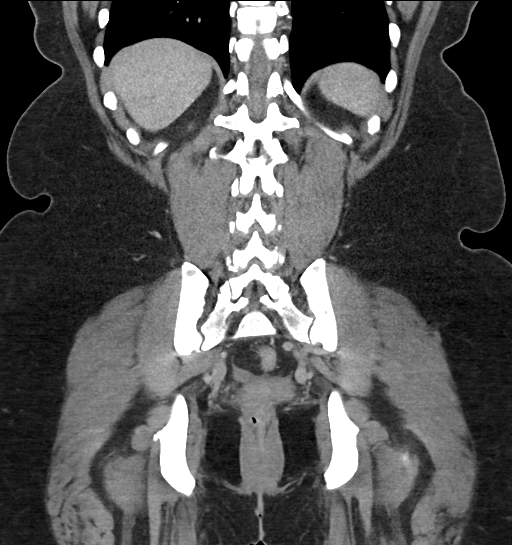
[im 53/118  soft-tissue]
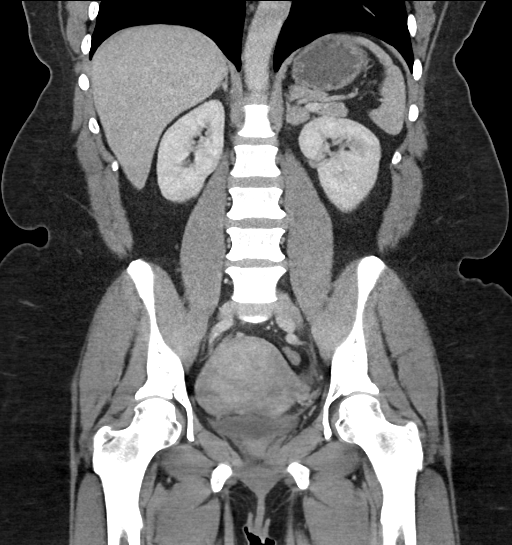
[im 66/118  soft-tissue]
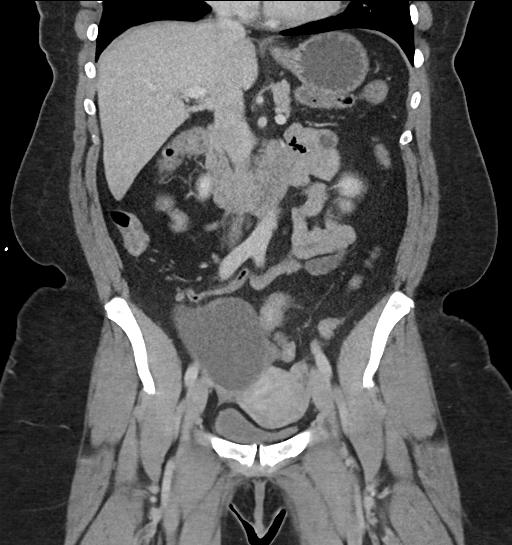

[16 of 46 positions shown; findings below may reference images not displayed]

RADIATION DOSE REDUCTION: This exam was performed according to the
departmental dose-optimization program which includes automated
exposure control, adjustment of the mA and/or kV according to
patient size and/or use of iterative reconstruction technique.

CONTRAST:  100mL OMNIPAQUE IOHEXOL 300 MG/ML  SOLN
FINDINGS: Lower chest: No acute abnormality.

Hepatobiliary: No focal liver abnormality is seen. No gallstones,
gallbladder wall thickening, or biliary dilatation.

Pancreas: Unremarkable. No pancreatic ductal dilatation or
surrounding inflammatory changes.

Spleen: Normal in size without focal abnormality.

Adrenals/Urinary Tract: Both kidneys demonstrate a few scattered
tiny subcentimeter cortical renal cysts. No further imaging
follow-up recommended. No hydronephrosis. No acute obstructive
uropathy, perinephric inflammation or associated hydroureter.
Ureters are symmetric and decompressed. Bladder collapsed.

Two small left adrenal nodules 1 measuring 17 mm and a second
measuring 14 mm. No available comparison studies. Lesions are
indeterminate by contrast CT but certainly could be adenomas.
Recommend follow-up nonemergent imaging with MRI utilizing an
adrenal protocol.

Normal right adrenal gland

Stomach/Bowel: Stomach is within normal limits. Appendix appears
normal. No evidence of bowel wall thickening, distention, or
inflammatory changes.

Vascular/Lymphatic: No significant vascular findings are present. No
enlarged abdominal or pelvic lymph nodes.

Reproductive: 8.6 cm simple appearing right adnexal likely ovarian
cyst. Nonspecific trace pelvic free fluid. Dominant right uterine
fibroid measures 6.3 cm by CT.

No pelvic hemorrhage, hematoma, fluid collection, or abscess.

Other: No abdominal wall hernia or abnormality. No abdominopelvic
ascites.

Musculoskeletal: Acute osseous finding.
IMPRESSION: No acute intra-abdominal or pelvic finding by CT.

Two indeterminate small left adrenal nodules as above. Recommend
follow-up nonemergent MRI.

8.6 cm simple appearing likely ovarian adnexal cyst. Ultrasound has
already been performed earlier today. Please refer to the ultrasound
report for recommendation.

Fibroid uterus.

Trace pelvic free fluid likely physiologic.

## 2023-08-09 DIAGNOSIS — E6609 Other obesity due to excess calories: Secondary | ICD-10-CM | POA: Diagnosis not present

## 2023-08-26 ENCOUNTER — Encounter: Payer: Self-pay | Admitting: Nurse Practitioner

## 2023-08-26 DIAGNOSIS — I10 Essential (primary) hypertension: Secondary | ICD-10-CM

## 2023-08-26 MED ORDER — TRIAMTERENE-HCTZ 37.5-25 MG PO CAPS: 1.0000 | ORAL_CAPSULE | Freq: Every day | ORAL | 0 refills | Status: DC

## 2023-09-04 ENCOUNTER — Ambulatory Visit
Admission: EM | Admit: 2023-09-04 | Discharge: 2023-09-04 | Disposition: A | Discharge status: Home / Self Care | Attending: Emergency Medicine | Admitting: Emergency Medicine

## 2023-09-04 ENCOUNTER — Encounter: Payer: Self-pay | Admitting: Emergency Medicine

## 2023-09-04 DIAGNOSIS — I10 Essential (primary) hypertension: Secondary | ICD-10-CM

## 2023-09-04 DIAGNOSIS — J069 Acute upper respiratory infection, unspecified: Secondary | ICD-10-CM | POA: Diagnosis not present

## 2023-09-04 MED ORDER — AZITHROMYCIN 250 MG PO TABS: 250.0000 mg | ORAL_TABLET | Freq: Every day | ORAL | 0 refills | Status: DC

## 2023-09-04 NOTE — Discharge Instructions (Addendum)
 Take the Zithromax as directed.   Your blood pressure is elevated today at 143/94.  Please have this rechecked by your primary care provider in 2-4 weeks.

## 2023-09-04 NOTE — ED Triage Notes (Signed)
 Patient reports non production cough x 10 days.  Patient states she does feel like she has sputum in chest. Patient reports she has been taking mucinex with no relief. Denies pain at this time.

## 2023-09-04 NOTE — ED Provider Notes (Signed)
 Arlander Bellman    CSN: [id number] Arrival date & time: 09/04/23  4132      History   Chief Complaint Chief Complaint  Patient presents with   Cough    HPI Ann Morgan is a 53 y.o. female.  Patient presents with 10-day history of hoarse voice, congestion, cough.  She has been taking Mucinex Max for her symptoms.  No fever, chills, wheezing, shortness of breath, chest pain.  Her medical history includes asthma and hypertension.  The history is provided by the patient and medical records.    Past Medical History:  Diagnosis Date   Abnormal Pap smear of cervix    Asthma    Endometrial hyperplasia    Hypertension 2004   Irregular menses    Mild intermittent asthma with acute exacerbation 10/19/2013   Late 2013 with a cough that lasted approximately one month.  PFTs showed FEV1 of 83%  Last Assessment & Plan:  Reports that since she was prescribed albuterol, she has used it maybe 1x to see how she would respond to it which seemed to help. However, she still has some coughing issues but thinks it is manageable. No issues with high pollen season.   Plan: - Continue with albuterol PRN.  - She will   Obesity     Patient Active Problem List   Diagnosis Date Noted   Meralgia paraesthetica, right 11/30/2022   Weight gain 11/30/2022   Adenoma of left adrenal gland 11/30/2022   Mixed hyperlipidemia 11/09/2022   Meralgia paresthetica of right side 11/09/2022   Adrenal nodule (HCC) 11/09/2021   Abdominal pain, left lower quadrant 08/31/2021   Left ovarian cyst 08/31/2021   Ovary, ovarian pedicle and fallopian tube torsion 08/31/2021   Abdominal pain 08/23/2021   Disorder of bursae of shoulder region 05/12/2021   Knee pain 05/12/2021   Tear of lateral cartilage or meniscus of knee, current 05/12/2021   Pain in joint of left knee 09/12/2020   Complications affecting other specified body systems, hypertension 09/02/2020   Muscle weakness 09/02/2020   Traumatic complete  tear of right rotator cuff 04/10/2019   Epidermal cyst of neck 02/17/2018   Skin abnormalities 07/15/2017   Endometrial hyperplasia without atypia, simple 05/17/2017   Patellofemoral pain syndrome of both knees 02/04/2017   Vasovagal syncope 02/04/2017   Glucose intolerance 01/01/2017   Overactive bladder 01/01/2017   Glucose intolerance 01/01/2017   Body mass index (BMI) of 45.0-49.9 in adult [facility]) 09/19/2015   Injury of left wrist 05/09/2015   Screening for STD (sexually transmitted disease) 04/06/2015   Varicose veins of both lower extremities with pain 10/27/2014   Mild intermittent asthma with acute exacerbation 10/19/2013   Breast cyst 12/11/2012   Abnormal mammogram 06/09/2012   Premenopausal menorrhagia 04/28/2012   Polycystic ovaries 03/29/2010   Class 3 severe obesity due to excess calories in adult 03/29/2010   Essential hypertension 03/29/2010    Past Surgical History:  Procedure Laterality Date   BREAST CYST ASPIRATION Left 2015   benign   CYSTOSCOPY  08/31/2021   Procedure: CYSTOSCOPY;  Surgeon: Kris Pester, MD;  Location: [facility] ORS;  Service: Gynecology;;   KNEE SURGERY Left jan 2011   orthoscopic   ROBOTIC ASSISTED LAPAROSCOPIC LYSIS OF ADHESION  08/31/2021   Procedure: XI ROBOTIC ASSISTED LAPAROSCOPIC LYSIS OF ADHESION;  Surgeon: Kris Pester, MD;  Location: [facility] ORS;  Service: Gynecology;;   ROBOTIC ASSISTED LAPAROSCOPIC OVARIAN CYSTECTOMY Left 08/31/2021   Procedure: XI ROBOTIC ASSISTED LAPAROSCOPIC LEFT  SALPINGO OOPHORECTOMY CYSTECTOMY;  Surgeon: Kris Pester, MD;  Location: [facility] ORS;  Service: Gynecology;  Laterality: Left;   SHOULDER ARTHROSCOPY WITH SUBACROMIAL DECOMPRESSION, ROTATOR CUFF REPAIR AND BICEP TENDON REPAIR Right 12/25/2018   Procedure: right shoulder arthroscopy, biceps tendon release with tenodesis, three tendon rotator cuff repair;  Surgeon: Jasmine Mesi, MD;  Location: [facility] OR;  Service: Orthopedics;  Laterality: Right;     OB History     Gravida  0   Para  0   Term  0   Preterm  0   AB  0   Living  0      SAB  0   IAB  0   Ectopic  0   Multiple  0   Live Births  0        Obstetric Comments  First menstrual at age 47          Home Medications    Prior to Admission medications   Medication Sig Start Date End Date Taking? Authorizing Provider  azithromycin (ZITHROMAX) 250 MG tablet Take 1 tablet (250 mg total) by mouth daily. Take first 2 tablets together, then 1 every day until finished. 09/04/23  Yes Wellington Half, NP  cyclobenzaprine  (FLEXERIL ) 10 MG tablet Take 1 tablet (10 mg total) by mouth 2 (two) times daily as needed for muscle spasms. 03/26/23   Wellington Half, NP  ibuprofen  (ADVIL ) 600 MG tablet Take 1 tablet (600 mg total) by mouth every 6 (six) hours as needed. 03/26/23   Wellington Half, NP  Prenatal Vit-Fe Fumarate-FA (PRENATAL MULTIVITAMIN) TABS Take 1 tablet by mouth daily.     [provider]  triamterene -hydrochlorothiazide (DYAZIDE) 37.5-25 MG capsule Take 1 each (1 capsule total) by mouth daily. 08/26/23   Quinton Buckler, FNP  WEGOVY  2.4 MG/0.75ML SOAJ Inject 2.4 mg into the skin once a week. 07/09/23   Quinton Buckler, FNP    Family History Family History  Problem Relation Age of Onset   Hypertension Mother    Diabetes Brother    Hypertension Brother    Breast cancer Maternal Grandmother 76    Social History Social History   Tobacco Use   Smoking status: Never   Smokeless tobacco: Never  Vaping Use   Vaping status: Never Used  Substance Use Topics   Alcohol use: No   Drug use: No     Allergies   Patient has no known allergies.   Review of Systems Review of Systems  Constitutional:  Negative for chills and fever.  HENT:  Positive for congestion and voice change. Negative for ear pain and sore throat.   Respiratory:  Positive for cough. Negative for shortness of breath and wheezing.   Cardiovascular:  Negative for chest pain and  palpitations.     Physical Exam Triage Vital Signs ED Triage Vitals [09/04/23 0916]  Encounter Vitals Group     BP (!) 143/94     Girls Systolic BP Percentile      Girls Diastolic BP Percentile      Boys Systolic BP Percentile      Boys Diastolic BP Percentile      Pulse Rate 67     Resp 18     Temp 97.9 F (36.6 C)     Temp Source Oral     SpO2 98 %     Weight      Height      Head Circumference      Peak  Flow      Pain Score      Pain Loc      Pain Education      Exclude from Growth Chart    No data found.  Updated Vital Signs BP (!) 143/94 (BP Location: Right Arm)   Pulse 67   Temp 97.9 F (36.6 C) (Oral)   Resp 18   LMP 11/17/2021   SpO2 98%   Visual Acuity Right Eye Distance:   Left Eye Distance:   Bilateral Distance:    Right Eye Near:   Left Eye Near:    Bilateral Near:     Physical Exam Constitutional:      General: She is not in acute distress. HENT:     Right Ear: Tympanic membrane normal.     Left Ear: Tympanic membrane normal.     Nose: Nose normal.     Mouth/Throat:     Mouth: Mucous membranes are moist.     Pharynx: Posterior oropharyngeal erythema present.   Cardiovascular:     Rate and Rhythm: Normal rate and regular rhythm.     Heart sounds: Normal heart sounds.  Pulmonary:     Effort: Pulmonary effort is normal. No respiratory distress.     Breath sounds: Normal breath sounds. No wheezing.   Neurological:     Mental Status: She is alert.      UC Treatments / Results  Labs (all labs ordered are listed, but only abnormal results are displayed) Labs Reviewed - No data to display  EKG   Radiology No results found.  Procedures Procedures (including critical care time)  Medications Ordered in UC Medications - No data to display  Initial Impression / Assessment and Plan / UC Course  I have reviewed the triage vital signs and the nursing notes.  Pertinent labs & imaging results that were available during my care of  the patient were reviewed by me and considered in my medical decision making (see chart for details).    Acute upper respiratory infection, elevated blood pressure reading with hypertension.  Patient chart shows history of asthma but she states she has not needed to use an albuterol inhaler in years.  Treating today with Zithromax.  Instructed patient to avoid OTC medications that can elevate her blood pressure.  Discussed that her blood pressure is mildly elevated today and needs to be rechecked by her PCP.  Education provided on upper respiratory infection and managing hypertension.  Patient agrees to plan of care.  Final Clinical Impressions(s) / UC Diagnoses   Final diagnoses:  Acute upper respiratory infection  Elevated blood pressure reading in office with diagnosis of hypertension     Discharge Instructions      Take the Zithromax as directed.   Your blood pressure is elevated today at 143/94.  Please have this rechecked by your primary care provider in 2-4 weeks.          ED Prescriptions     Medication Sig Dispense Auth. Provider   azithromycin (ZITHROMAX) 250 MG tablet Take 1 tablet (250 mg total) by mouth daily. Take first 2 tablets together, then 1 every day until finished. 6 tablet Wellington Half, NP      PDMP not reviewed this encounter.   Wellington Half, NP 09/04/23 [phone]

## 2023-11-14 NOTE — Progress Notes (Unsigned)
 LMP 11/17/2021    Subjective:    Patient ID: Ann Morgan, female    DOB: 08/18/1970, 54 y.o.   MRN: [id number]  HPI: Ann Morgan is a 53 y.o. female presenting today for a 6 month follow-up for chronic medical management.  Obesity: -Reports physical activity -Taking Wegovy  1.7mg   Hypertension:   Asthma:  Adrenal module:  General health maintenance: -Order routine lab work :             11/09/2022    8:45 AM 05/11/2022    8:55 AM 11/09/2021    8:31 AM  Depression screen PHQ 2/9  Decreased Interest 0 0 0  Down, Depressed, Hopeless 0 0 0  PHQ - 2 Score 0 0 0    Relevant past medical, surgical, family and social history reviewed and updated as indicated. Interim medical history since our last visit reviewed. Allergies and medications reviewed and updated.  Review of Systems  Per HPI unless specifically indicated above     Objective:     LMP 11/17/2021   {Vitals History (Optional):23777} Wt Readings from Last 3 Encounters:  05/17/23 (!) 300 lb 12.8 oz (136.4 kg)  01/03/23 (!) 313 lb 11.2 oz (142.3 kg)  11/30/22 (!) 314 lb (142.4 kg)    Physical Exam   Results for orders placed or performed in visit on 05/17/23  CBC with Differential/Platelet   Collection Time: 05/17/23  9:52 AM  Result Value Ref Range   WBC 5.3 3.8 - 10.8 Thousand/uL   RBC 4.65 3.80 - 5.10 Million/uL   Hemoglobin 13.5 11.7 - 15.5 g/dL   HCT 58.2 64.9 - 54.9 %   MCV 89.7 80.0 - 100.0 fL   MCH 29.0 27.0 - 33.0 pg   MCHC 32.4 32.0 - 36.0 g/dL   RDW 86.5 88.9 - 84.9 %   Platelets 339 140 - 400 Thousand/uL   MPV 9.5 7.5 - 12.5 fL   Neutro Abs 3,148 1,500 - 7,800 cells/uL   Absolute Lymphocytes 1,516 850 - 3,900 cells/uL   Absolute Monocytes 456 200 - 950 cells/uL   Eosinophils Absolute 148 15 - 500 cells/uL   Basophils Absolute 32 0 - 200 cells/uL   Neutrophils Relative % 59.4 %   Total Lymphocyte 28.6 %   Monocytes Relative 8.6 %   Eosinophils Relative 2.8 %    Basophils Relative 0.6 %  Comprehensive metabolic panel   Collection Time: 05/17/23  9:52 AM  Result Value Ref Range   Glucose, Bld 78 65 - 99 mg/dL   BUN 14 7 - 25 mg/dL   Creat 9.00 9.49 - 8.96 mg/dL   BUN/Creatinine Ratio SEE NOTE: 6 - 22 (calc)   Sodium 140 135 - 146 mmol/L   Potassium 3.5 3.5 - 5.3 mmol/L   Chloride 100 98 - 110 mmol/L   CO2 33 (H) 20 - 32 mmol/L   Calcium 9.8 8.6 - 10.4 mg/dL   Total Protein 7.5 6.1 - 8.1 g/dL   Albumin 4.4 3.6 - 5.1 g/dL   Globulin 3.1 1.9 - 3.7 g/dL (calc)   AG Ratio 1.4 1.0 - 2.5 (calc)   Total Bilirubin 0.5 0.2 - 1.2 mg/dL   Alkaline phosphatase (APISO) 89 37 - 153 U/L   AST 21 10 - 35 U/L   ALT 16 6 - 29 U/L  Hemoglobin A1c   Collection Time: 05/17/23  9:52 AM  Result Value Ref Range   Hgb A1c MFr Bld 5.7 (H) <5.7 % of total Hgb  Mean Plasma Glucose 117 mg/dL   eAG (mmol/L) 6.5 mmol/L   {Labs (Optional):23779}       Assessment & Plan:   Problem List Items Addressed This Visit   None    Assessment and Plan         Follow up plan: No follow-ups on file.

## 2023-11-15 ENCOUNTER — Ambulatory Visit (INDEPENDENT_AMBULATORY_CARE_PROVIDER_SITE_OTHER): Payer: BC Managed Care – PPO | Admitting: Nurse Practitioner

## 2023-11-15 ENCOUNTER — Encounter: Payer: Self-pay | Admitting: Nurse Practitioner

## 2023-11-15 VITALS — BP 124/78 | HR 79 | Temp 98.4°F | Resp 16 | Ht 68.0 in | Wt 284.3 lb

## 2023-11-15 DIAGNOSIS — Z131 Encounter for screening for diabetes mellitus: Secondary | ICD-10-CM

## 2023-11-15 DIAGNOSIS — J4520 Mild intermittent asthma, uncomplicated: Secondary | ICD-10-CM

## 2023-11-15 DIAGNOSIS — E279 Disorder of adrenal gland, unspecified: Secondary | ICD-10-CM

## 2023-11-15 DIAGNOSIS — Z6841 Body Mass Index (BMI) 40.0 and over, adult: Secondary | ICD-10-CM

## 2023-11-15 DIAGNOSIS — E66813 Obesity, class 3: Secondary | ICD-10-CM | POA: Diagnosis not present

## 2023-11-15 DIAGNOSIS — J4521 Mild intermittent asthma with (acute) exacerbation: Secondary | ICD-10-CM

## 2023-11-15 DIAGNOSIS — I10 Essential (primary) hypertension: Secondary | ICD-10-CM | POA: Diagnosis not present

## 2023-11-15 DIAGNOSIS — E782 Mixed hyperlipidemia: Secondary | ICD-10-CM

## 2023-11-15 MED ORDER — TRIAMTERENE-HCTZ 37.5-25 MG PO CAPS: 1.0000 | ORAL_CAPSULE | Freq: Every day | ORAL | 1 refills | Status: AC

## 2023-11-15 NOTE — Assessment & Plan Note (Signed)
 Asthma is well controlled. Use albuterol inhaler as needed.

## 2023-11-15 NOTE — Assessment & Plan Note (Signed)
 Continue with annual follow-ups

## 2023-11-15 NOTE — Assessment & Plan Note (Addendum)
 Continue limiting saturated fats and working on diet and exercise. Checking lipid panel at 6 month follow-up since patient had labs done through [facility] recently in June 2025.

## 2023-11-15 NOTE — Assessment & Plan Note (Signed)
 Continue taking the Wegovy  1mg . Advised to watch calorie intake and limit carbohydrates. Continue with weekly exercise trying to get 150 min/week of physical activity.

## 2023-11-15 NOTE — Assessment & Plan Note (Signed)
 BP today was 124/78. Continue monitoring blood pressure at home. Continuing reducing salt intake. Refill of Dyazide 37.5- 25mg  sent to pharmacy.

## 2023-11-29 ENCOUNTER — Ambulatory Visit: Payer: BC Managed Care – PPO | Admitting: Internal Medicine

## 2023-11-29 ENCOUNTER — Encounter: Payer: Self-pay | Admitting: Internal Medicine

## 2023-11-29 VITALS — BP 126/82 | HR 62 | Ht 68.0 in | Wt 286.0 lb

## 2023-11-29 DIAGNOSIS — D3502 Benign neoplasm of left adrenal gland: Secondary | ICD-10-CM

## 2023-11-29 MED ORDER — DEXAMETHASONE 1 MG PO TABS: 1.0000 mg | ORAL_TABLET | Freq: Once | ORAL | 0 refills | Status: AC

## 2023-11-29 NOTE — Patient Instructions (Signed)
   Instructions for Dexamethasone Suppression Test   Step 1: Choose a morning when you can come to our lab at 8:00 am for a blood draw.   Step 2: On the night before the blood draw, take one 1 mg tablet of dexamethasone at 11:30 pm.  The timing is VERY important!   Step 3: The next morning, go to the lab for blood work at 8:00 am.  Bonita Quin do not have to be on an empty stomach, but the timing is VERY important!

## 2023-11-29 NOTE — Progress Notes (Signed)
 Name: Ann Morgan  MRN/ DOB: [id number], December 20, 1970    Age/ Sex: 53 y.o., female    PCP: Gareth Mliss FALCON, FNP   Reason for Endocrinology Evaluation: Left adrenal adenoma     Date of Initial Endocrinology Evaluation: 11/28/2021    HPI: Ann Morgan is a 53 y.o. female with a past medical history of HTN, asthma. The patient presented for initial endocrinology clinic visit on 11/28/2021 for consultative assistance with her left adrenal adenoma.   During evaluation for abdominal pain the patient has been noted on CT imaging to have 2 left adrenal nodules, one of them is 1 cm and the other is 1.4 cm.  She is S/P left ovarian cystectomy 08/2021  In 11/2021 she had normal 24-hour cortisol of 29.1 mcg, catecholamines, normal aldosterone, renin and elevated Aldo/renin ratio   Saliva cortisol x 2 , aldosterone were normal 11/2022  SUBJECTIVE:    Today (11/29/23):  Ann Morgan is here for follow-up on left adrenal adenoma    Substantial weight gain- no .  Patient has been noted weight loss, patient on Wegovy  Severe hypertension- no DM-no, patient with prediabetes  Proximal muscle weakness-no Anxiety attacks-no Cardiac arrhythmias-no Palpitations-no Fluid retention- no Hypokalemia-not since 2023, of note patient on Dyazide No constipation or diarrhea  No    Bedtime has been as late as 1 am   HISTORY:  Past Medical History:  Past Medical History:  Diagnosis Date   Abnormal Pap smear of cervix    Asthma    Endometrial hyperplasia    Hypertension 2004   Irregular menses    Mild intermittent asthma with acute exacerbation 10/19/2013   Late 2013 with a cough that lasted approximately one month.  PFTs showed FEV1 of 83%  Last Assessment & Plan:  Reports that since she was prescribed albuterol, she has used it maybe 1x to see how she would respond to it which seemed to help. However, she still has some coughing issues but thinks it is manageable. No issues  with high pollen season.   Plan: - Continue with albuterol PRN.  - She will   Obesity    Past Surgical History:  Past Surgical History:  Procedure Laterality Date   BREAST CYST ASPIRATION Left 2015   benign   CYSTOSCOPY  08/31/2021   Procedure: CYSTOSCOPY;  Surgeon: Leonce Garnette BIRCH, MD;  Location: [facility] ORS;  Service: Gynecology;;   KNEE SURGERY Left jan 2011   orthoscopic   ROBOTIC ASSISTED LAPAROSCOPIC LYSIS OF ADHESION  08/31/2021   Procedure: XI ROBOTIC ASSISTED LAPAROSCOPIC LYSIS OF ADHESION;  Surgeon: Leonce Garnette BIRCH, MD;  Location: [facility] ORS;  Service: Gynecology;;   ROBOTIC ASSISTED LAPAROSCOPIC OVARIAN CYSTECTOMY Left 08/31/2021   Procedure: XI ROBOTIC ASSISTED LAPAROSCOPIC LEFT SALPINGO OOPHORECTOMY CYSTECTOMY;  Surgeon: Leonce Garnette BIRCH, MD;  Location: [facility] ORS;  Service: Gynecology;  Laterality: Left;   SHOULDER ARTHROSCOPY WITH SUBACROMIAL DECOMPRESSION, ROTATOR CUFF REPAIR AND BICEP TENDON REPAIR Right 12/25/2018   Procedure: right shoulder arthroscopy, biceps tendon release with tenodesis, three tendon rotator cuff repair;  Surgeon: Addie Cordella Hamilton, MD;  Location: [facility] OR;  Service: Orthopedics;  Laterality: Right;    Social History:  reports that she has never smoked. She has never used smokeless tobacco. She reports that she does not drink alcohol and does not use drugs. Family History: family history includes Breast cancer (age of onset: 64) in her maternal grandmother; Diabetes in her brother; Hypertension in her brother and mother.  HOME MEDICATIONS: Allergies as of 11/29/2023   No Known Allergies      Medication List        Accurate as of November 29, 2023  2:06 PM. If you have any questions, ask your nurse or doctor.          prenatal multivitamin Tabs tablet Take 1 tablet by mouth daily.   triamterene -hydrochlorothiazide 37.5-25 MG capsule Commonly known as: DYAZIDE Take 1 each (1 capsule total) by mouth daily.   Wegovy  2.4 MG/0.75ML Soaj SQ  injection Generic drug: semaglutide -weight management Inject 2.4 mg into the skin once a week.   Wegovy  0.5 MG/0.5ML Soaj SQ injection Generic drug: semaglutide -weight management Inject 0.5 mg into the skin once a week.          REVIEW OF SYSTEMS: A comprehensive ROS was conducted with the patient and is negative except as per HPI     OBJECTIVE:  VS: BP 126/82 (BP Location: Left Arm, Patient Position: Sitting, Cuff Size: Normal)   Pulse 62   Ht 5' 8 (1.727 m)   Wt 286 lb (129.7 kg)   LMP 11/17/2021   SpO2 99%   BMI 43.49 kg/m    Wt Readings from Last 3 Encounters:  11/29/23 286 lb (129.7 kg)  11/15/23 284 lb 4.8 oz (129 kg)  05/17/23 (!) 300 lb 12.8 oz (136.4 kg)     EXAM: General: Pt appears well and is in NAD  Neck: General: Supple without adenopathy. Thyroid : Thyroid  size normal.  No goiter or nodules appreciated.  Lungs: Clear with good BS bilat with no rales, rhonchi, or wheezes  Heart: Auscultation: RRR.  Abdomen: Normoactive bowel sounds, soft, nontender  Extremities:  BL LE: trace pretibial edema   Mental Status: Judgment, insight: Intact Orientation: Oriented to time, place, and person Mood and affect: No depression, anxiety, or agitation     DATA REVIEWED:     Latest Reference Range & Units 11/29/23 14:35  Potassium 3.5 - 5.3 mmol/L 4.0    Latest Reference Range & Units 11/09/22 09:38  Sodium 135 - 146 mmol/L 142  Potassium 3.5 - 5.3 mmol/L 3.7  Chloride 98 - 110 mmol/L 105  CO2 20 - 32 mmol/L 30  Glucose 65 - 99 mg/dL 87  Mean Plasma Glucose mg/dL 876  BUN 7 - 25 mg/dL 18  Creatinine 9.49 - 8.96 mg/dL 9.04  Calcium 8.6 - 89.5 mg/dL 9.3  BUN/Creatinine Ratio 6 - 22 (calc) SEE NOTE:  eGFR > OR = 60 mL/min/1.23m2 73  AG Ratio 1.0 - 2.5 (calc) 1.4  AST 10 - 35 U/L 22  ALT 6 - 29 U/L 18  Total Protein 6.1 - 8.1 g/dL 6.7  Total Bilirubin 0.2 - 1.2 mg/dL 0.3    CT abdomen 05/18/7974   Adrenals/Urinary Tract: Both kidneys demonstrate a  few scattered tiny subcentimeter cortical renal cysts. No further imaging follow-up recommended. No hydronephrosis. No acute obstructive uropathy, perinephric inflammation or associated hydroureter. Ureters are symmetric and decompressed. Bladder collapsed.   Two small left adrenal nodules 1 measuring 17 mm and a second measuring 14 mm. No available comparison studies. Lesions are indeterminate by contrast CT but certainly could be adenomas. Recommend follow-up nonemergent imaging with MRI utilizing an adrenal protocol.   Normal right adrenal gland     CT Abdomen 12/20/2022   Adrenals/Urinary Tract: Stable 16 mm left adrenal nodule on image 44/2 which measures Hounsfield units of 15 pre contrast administration forty-seven postcontrast administration and 25 on delayed. Stable 9 mm  adrenal nodule on image 50/2 which demonstrates similar imaging characteristics. These are compatible with a benign adrenal adenomas requiring no additional independent imaging follow-up.       IMPRESSION: Stable benign left adrenal adenomas requiring no additional independent imaging follow-up.     ASSESSMENT/PLAN/RECOMMENDATIONS:   Left adrenal Adenoma    - Adrenal imaging was stable from 2023 to 2024 - Saliva cortisol was normal x 2 in October 2024, her sleeping pattern has changed and we will proceed with dexamethasone  suppression test -Aldo/PRA ratio was elevated at 60 in 11/2022  - Saline loading was normal with aldosterone down from 17.8 to 8.1 in October, 2024 - Serum potassium is normal today, although, renin pending   F/U in 1 yr    Signed electronically by: Stefano Redgie Butts, MD  [facility] Endocrinology  [facility] [address] [place], [place] [address] Phone: [phone] FAX: [phone]   CC: Gareth Mliss FALCON, FNP [address] [place] [address] Phone: [phone] Fax: [phone]   Return to Endocrinology  clinic as below: Future Appointments  Date Time Provider Department Center  05/18/2024  9:40 AM Gareth, Mliss FALCON, FNP [facility]

## 2023-12-02 ENCOUNTER — Other Ambulatory Visit: Payer: Self-pay

## 2023-12-02 ENCOUNTER — Ambulatory Visit: Payer: Self-pay | Admitting: Internal Medicine

## 2023-12-06 ENCOUNTER — Other Ambulatory Visit

## 2023-12-06 DIAGNOSIS — D3502 Benign neoplasm of left adrenal gland: Secondary | ICD-10-CM | POA: Diagnosis not present

## 2023-12-07 LAB — ALDOSTERONE + RENIN ACTIVITY W/ RATIO
ALDO / PRA Ratio: 40.7 ratio — ABNORMAL HIGH (ref 0.9–28.9)
Aldosterone: 11 ng/dL
Renin Activity: 0.27 ng/mL/h (ref 0.25–5.82)

## 2023-12-07 LAB — POTASSIUM: Potassium: 4 mmol/L (ref 3.5–5.3)

## 2023-12-09 LAB — CORTISOL: Cortisol, Plasma: 3.3 ug/dL

## 2023-12-11 DIAGNOSIS — H5213 Myopia, bilateral: Secondary | ICD-10-CM | POA: Diagnosis not present

## 2023-12-11 DIAGNOSIS — H35033 Hypertensive retinopathy, bilateral: Secondary | ICD-10-CM | POA: Diagnosis not present

## 2023-12-11 DIAGNOSIS — H524 Presbyopia: Secondary | ICD-10-CM | POA: Diagnosis not present

## 2023-12-20 ENCOUNTER — Other Ambulatory Visit: Payer: Self-pay | Admitting: Obstetrics and Gynecology

## 2023-12-20 DIAGNOSIS — Z1231 Encounter for screening mammogram for malignant neoplasm of breast: Secondary | ICD-10-CM

## 2024-01-03 ENCOUNTER — Other Ambulatory Visit: Payer: Self-pay

## 2024-01-03 ENCOUNTER — Encounter: Payer: Self-pay | Admitting: Physician Assistant

## 2024-01-03 ENCOUNTER — Ambulatory Visit: Admitting: Physician Assistant

## 2024-01-03 DIAGNOSIS — M79672 Pain in left foot: Secondary | ICD-10-CM | POA: Diagnosis not present

## 2024-01-03 NOTE — Progress Notes (Signed)
 Office Visit Note   Patient: Ann Morgan           Date of Birth: 15-Jun-1970           MRN: [id number] Visit Date: 01/03/2024              Requested by: Gareth Mliss FALCON, FNP [address] [place],  [place] [address] PCP: Gareth Mliss FALCON, FNP   Assessment & Plan: Visit Diagnoses:  1. Pain in left foot     Plan: Patient is a 53 year old woman who comes in today with a chief complaint of left lateral distal foot pain.  She is a patient of Dr. Sedrick.  This has been going on about 1 or 2 months.  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  The area is painful and tender to the touch she feels like the bone protrudes more.  Findings today consistent with a small tailor's bunion with a little bursitis overlying it.  This is actually getting a little bit better.  I have asked her she could use some Voltaren gel on this also should wear shoes with a wide toebox.  Can follow-up if this does not continue to prove  Follow-Up Instructions: Return in about 3 weeks (around 01/24/2024).   Orders:  Orders Placed This Encounter  Procedures   XR Foot Complete Left   No orders of the defined types were placed in this encounter.     Procedures: No procedures performed   Clinical Data: No additional findings.   Subjective: Chief Complaint  Patient presents with   Left Foot - Pain    HPI patient is a pleasant 53 year old woman comes in today with a 1 to 60-month history of left distal lateral foot pain at the base of the fifth digit.  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  She does not remember any trauma she does not remember any ecchymosis.  She says this is getting just a little bit better  Review of Systems  All other systems reviewed and are  negative.    Objective: Vital Signs: LMP 11/17/2021   Physical Exam Constitutional:      Appearance: Normal appearance.  Pulmonary:     Effort: Pulmonary effort is normal.  Skin:    General: Skin is warm and dry.  Neurological:     General: No focal deficit present.     Mental Status: She is alert and oriented to person, place, and time.  Psychiatric:        Mood and Affect: Mood normal.        Behavior: Behavior normal.     Ortho Exam Examination of her left foot pulses intact compartments are soft and compressible.  She has good plantarflexion dorsiflexion eversion inversion of her foot.  She does have some tenderness over the bursa at the base of the the fifth digit.  There is no redness no fluctuance.  No significant swelling Specialty Comments:  No specialty comments available.  Imaging: No results found.   PMFS History: Patient Active Problem List   Diagnosis Date Noted   Meralgia paraesthetica, right 11/30/2022   Weight gain 11/30/2022   Adenoma of left adrenal gland 11/30/2022   Mixed hyperlipidemia 11/09/2022   Meralgia  paresthetica of right side 11/09/2022   Adrenal nodule 11/09/2021   Abdominal pain, left lower quadrant 08/31/2021   Left ovarian cyst 08/31/2021   Ovary, ovarian pedicle and fallopian tube torsion 08/31/2021   Abdominal pain 08/23/2021   Disorder of bursae of shoulder region 05/12/2021   Knee pain 05/12/2021   Tear of lateral cartilage or meniscus of knee, current 05/12/2021   Pain in joint of left knee 09/12/2020   Complications affecting other specified body systems, hypertension 09/02/2020   Muscle weakness 09/02/2020   Traumatic complete tear of right rotator cuff 04/10/2019   Epidermal cyst of neck 02/17/2018   Skin abnormalities 07/15/2017   Endometrial hyperplasia without atypia, simple 05/17/2017   Patellofemoral pain syndrome of both knees 02/04/2017   Vasovagal syncope 02/04/2017   Glucose intolerance 01/01/2017    Overactive bladder 01/01/2017   Glucose intolerance 01/01/2017   Body mass index (BMI) of 45.0-49.9 in adult [facility]) 09/19/2015   Injury of left wrist 05/09/2015   Screening for STD (sexually transmitted disease) 04/06/2015   Varicose veins of both lower extremities with pain 10/27/2014   Mild intermittent asthma with acute exacerbation 10/19/2013   Breast cyst 12/11/2012   Abnormal mammogram 06/09/2012   Premenopausal menorrhagia 04/28/2012   Polycystic ovaries 03/29/2010   Class 3 severe obesity due to excess calories in adult [facility]) 03/29/2010   Essential hypertension 03/29/2010   Past Medical History:  Diagnosis Date   Abnormal Pap smear of cervix    Asthma    Endometrial hyperplasia    Hypertension 2004   Irregular menses    Mild intermittent asthma with acute exacerbation 10/19/2013   Late 2013 with a cough that lasted approximately one month.  PFTs showed FEV1 of 83%  Last Assessment & Plan:  Reports that since she was prescribed albuterol, she has used it maybe 1x to see how she would respond to it which seemed to help. However, she still has some coughing issues but thinks it is manageable. No issues with high pollen season.   Plan: - Continue with albuterol PRN.  - She will   Obesity     Family History  Problem Relation Age of Onset   Hypertension Mother    Diabetes Brother    Hypertension Brother    Breast cancer Maternal Grandmother 50    Past Surgical History:  Procedure Laterality Date   BREAST CYST ASPIRATION Left 2015   benign   CYSTOSCOPY  08/31/2021   Procedure: CYSTOSCOPY;  Surgeon: Leonce Garnette BIRCH, MD;  Location: [facility] ORS;  Service: Gynecology;;   KNEE SURGERY Left jan 2011   orthoscopic   ROBOTIC ASSISTED LAPAROSCOPIC LYSIS OF ADHESION  08/31/2021   Procedure: XI ROBOTIC ASSISTED LAPAROSCOPIC LYSIS OF ADHESION;  Surgeon: Leonce Garnette BIRCH, MD;  Location: [facility] ORS;  Service: Gynecology;;   ROBOTIC ASSISTED LAPAROSCOPIC OVARIAN CYSTECTOMY Left 08/31/2021    Procedure: XI ROBOTIC ASSISTED LAPAROSCOPIC LEFT SALPINGO OOPHORECTOMY CYSTECTOMY;  Surgeon: Leonce Garnette BIRCH, MD;  Location: [facility] ORS;  Service: Gynecology;  Laterality: Left;   SHOULDER ARTHROSCOPY WITH SUBACROMIAL DECOMPRESSION, ROTATOR CUFF REPAIR AND BICEP TENDON REPAIR Right 12/25/2018   Procedure: right shoulder arthroscopy, biceps tendon release with tenodesis, three tendon rotator cuff repair;  Surgeon: Addie Cordella Hamilton, MD;  Location: [facility] OR;  Service: Orthopedics;  Laterality: Right;   Social History   Occupational History   Not on file  Tobacco Use   Smoking status: Never   Smokeless tobacco: Never  Vaping Use   Vaping status: Never Used  Substance and Sexual Activity   Alcohol use: No   Drug use: No   Sexual activity: Yes    Partners: Male    Birth control/protection: None

## 2024-01-20 ENCOUNTER — Encounter: Payer: Self-pay | Admitting: Radiology

## 2024-03-06 ENCOUNTER — Ambulatory Visit: Admitting: Sports Medicine

## 2024-03-06 ENCOUNTER — Other Ambulatory Visit: Payer: Self-pay

## 2024-03-06 ENCOUNTER — Encounter: Payer: Self-pay | Admitting: Sports Medicine

## 2024-03-06 DIAGNOSIS — M545 Low back pain, unspecified: Secondary | ICD-10-CM

## 2024-03-06 DIAGNOSIS — M5136 Other intervertebral disc degeneration, lumbar region with discogenic back pain only: Secondary | ICD-10-CM | POA: Diagnosis not present

## 2024-03-06 DIAGNOSIS — G8929 Other chronic pain: Secondary | ICD-10-CM

## 2024-03-06 NOTE — Progress Notes (Signed)
 Patient says that about one week ago, she was lifting a table and felt her lower back catch. She says that this is not the first time that this has happened with this mechanism over the last few months. She denies any sharp pain, but rather a catching in the lower back and feeling as though it tightens up. She did use heat once which did not make much of a difference, and otherwise rested for a few days until her symptoms improved. She says that she feels okay today. She denies any radicular symptoms in the legs. She has not taken any medication for her pain.

## 2024-03-06 NOTE — Progress Notes (Signed)
 "  Ann Morgan - 53 y.o. female MRN [id number]  Date of birth: 05-09-1970  Office Visit Note: Visit Date: 03/06/2024 PCP: Gareth Mliss FALCON, FNP Referred by: Gareth Mliss FALCON, FNP  Subjective: Chief Complaint  Patient presents with   Lower Back - Pain   HPI: Ann Morgan is a pleasant 53 y.o. female who presents today for acute on chronic low back pain.  Ann Morgan states that about one week ago, she was lifting a table and felt her lower back catch. She says that this is not the first time that this has happened with this mechanism over the last few months. She denies any sharp pain, but rather a catching in the lower back and feeling as though it tightens up. She did use heat once which did not make much of a difference, and otherwise rested for a few days until her symptoms improved. She says that she feels okay today. She denies any radicular symptoms in the legs. She has not taken any medication for her pain.  Her pain has largely improved, but given the fact this has happened several times she wanted further evaluation to work on prevention of these flares.  Pertinent ROS were reviewed with the patient and found to be negative unless otherwise specified above in HPI.   Assessment & Plan: Visit Diagnoses:  1. Chronic midline low back pain without sciatica   2. Degeneration of intervertebral disc of lumbar region with discogenic back pain    Plan: Impression is acute on chronic low back pain where she has had several episodes of the back giving out on her.  Her most recent episode happened just over a week ago and is largely improved.  We did review lumbar spine x-rays which show mild degenerative disc disease with anterior spurring at the L3-L4 region.  Discussed the pathophysiology of discogenic low back pain as well as contributing factors including her lumbar paraspinal hypertonicity and poor posterior chain activation.  Given her pain is largely improved, I do not think  she needs any medication.  She may use heat and/or over-the-counter anti-inflammatories only as needed.  We will get her started in formalized physical therapy with transition to home exercise plan., referral sent today.  I will see her back in about 6 weeks only if she is not having significant relief of her pain.  Follow-up: Return if symptoms worsen or fail to improve.   Meds & Orders: No orders of the defined types were placed in this encounter.   Orders Placed This Encounter  Procedures   XR Lumbar Spine 2-3 Views   Ambulatory referral to Physical Therapy     Procedures: No procedures performed      Clinical History: No specialty comments available.  She reports that she has never smoked. She has never used smokeless tobacco.  Recent Labs    05/17/23 0952  HGBA1C 5.7*    Objective:   Vital Signs: LMP 11/17/2021   Physical Exam  Gen: Well-appearing, in no acute distress; non-toxic CV: Well-perfused. Warm.  Resp: Breathing unlabored on room air; no wheezing. Psych: Fluid speech in conversation; appropriate affect; normal thought process  Ortho Exam - Lumbar: There is mild TTP near the midline and the associated paraspinal musculature near the L3-L4 region.  There is full range of motion with flexion as well as extension.  Negative straight leg raise bilaterally.  There is some tightness in the hip flexors as well as poor activation of the lumbar paraspinal musculature and  posterior chain.  Imaging: XR Lumbar Spine 2-3 Views Result Date: 03/06/2024 2 views of the lumbar spine including AP and lateral femoral ordered and reviewed by myself today.  X-rays demonstrate mild degenerative disc disease at the L3-L4, L4-L5 level.  There is associated anterior spurring at the L3 and L4 vertebra.  There is no listhesis present.  There is a mild degree of L5 facet arthropathy.  Otherwise no acute bony abnormality, preserved vertebral disc heights.   Past  Medical/Family/Surgical/Social History: Medications & Allergies reviewed per EMR, new medications updated. Patient Active Problem List   Diagnosis Date Noted   Meralgia paraesthetica, right 11/30/2022   Weight gain 11/30/2022   Adenoma of left adrenal gland 11/30/2022   Mixed hyperlipidemia 11/09/2022   Meralgia paresthetica of right side 11/09/2022   Adrenal nodule 11/09/2021   Abdominal pain, left lower quadrant 08/31/2021   Left ovarian cyst 08/31/2021   Ovary, ovarian pedicle and fallopian tube torsion 08/31/2021   Abdominal pain 08/23/2021   Disorder of bursae of shoulder region 05/12/2021   Knee pain 05/12/2021   Tear of lateral cartilage or meniscus of knee, current 05/12/2021   Pain in joint of left knee 09/12/2020   Complications affecting other specified body systems, hypertension 09/02/2020   Muscle weakness 09/02/2020   Traumatic complete tear of right rotator cuff 04/10/2019   Epidermal cyst of neck 02/17/2018   Skin abnormalities 07/15/2017   Endometrial hyperplasia without atypia, simple 05/17/2017   Patellofemoral pain syndrome of both knees 02/04/2017   Vasovagal syncope 02/04/2017   Glucose intolerance 01/01/2017   Overactive bladder 01/01/2017   Glucose intolerance 01/01/2017   Body mass index (BMI) of 45.0-49.9 in adult [facility]) 09/19/2015   Injury of left wrist 05/09/2015   Screening for STD (sexually transmitted disease) 04/06/2015   Varicose veins of both lower extremities with pain 10/27/2014   Mild intermittent asthma with acute exacerbation 10/19/2013   Breast cyst 12/11/2012   Abnormal mammogram 06/09/2012   Premenopausal menorrhagia 04/28/2012   Polycystic ovaries 03/29/2010   Class 3 severe obesity due to excess calories in adult [facility]) 03/29/2010   Essential hypertension 03/29/2010   Past Medical History:  Diagnosis Date   Abnormal Pap smear of cervix    Asthma    Endometrial hyperplasia    Hypertension 2004   Irregular menses    Mild  intermittent asthma with acute exacerbation 10/19/2013   Late 2013 with a cough that lasted approximately one month.  PFTs showed FEV1 of 83%  Last Assessment & Plan:  Reports that since she was prescribed albuterol, she has used it maybe 1x to see how she would respond to it which seemed to help. However, she still has some coughing issues but thinks it is manageable. No issues with high pollen season.   Plan: - Continue with albuterol PRN.  - She will   Obesity    Family History  Problem Relation Age of Onset   Hypertension Mother    Diabetes Brother    Hypertension Brother    Breast cancer Maternal Grandmother 36   Past Surgical History:  Procedure Laterality Date   BREAST CYST ASPIRATION Left 2015   benign   CYSTOSCOPY  08/31/2021   Procedure: CYSTOSCOPY;  Surgeon: Leonce Garnette BIRCH, MD;  Location: [facility] ORS;  Service: Gynecology;;   KNEE SURGERY Left jan 2011   orthoscopic   ROBOTIC ASSISTED LAPAROSCOPIC LYSIS OF ADHESION  08/31/2021   Procedure: XI ROBOTIC ASSISTED LAPAROSCOPIC LYSIS OF ADHESION;  Surgeon: Leonce Garnette BIRCH,  MD;  Location: [facility] ORS;  Service: Gynecology;;   ROBOTIC ASSISTED LAPAROSCOPIC OVARIAN CYSTECTOMY Left 08/31/2021   Procedure: XI ROBOTIC ASSISTED LAPAROSCOPIC LEFT SALPINGO OOPHORECTOMY CYSTECTOMY;  Surgeon: Leonce Garnette BIRCH, MD;  Location: [facility] ORS;  Service: Gynecology;  Laterality: Left;   SHOULDER ARTHROSCOPY WITH SUBACROMIAL DECOMPRESSION, ROTATOR CUFF REPAIR AND BICEP TENDON REPAIR Right 12/25/2018   Procedure: right shoulder arthroscopy, biceps tendon release with tenodesis, three tendon rotator cuff repair;  Surgeon: Addie Cordella Hamilton, MD;  Location: [facility] OR;  Service: Orthopedics;  Laterality: Right;   Social History   Occupational History   Not on file  Tobacco Use   Smoking status: Never   Smokeless tobacco: Never  Vaping Use   Vaping status: Never Used  Substance and Sexual Activity   Alcohol use: No   Drug use: No   Sexual activity: Yes     Partners: Male    Birth control/protection: None   "

## 2024-03-25 ENCOUNTER — Ambulatory Visit: Admitting: Physical Therapy

## 2024-03-25 ENCOUNTER — Encounter: Payer: Self-pay | Admitting: Physical Therapy

## 2024-03-25 DIAGNOSIS — M5459 Other low back pain: Secondary | ICD-10-CM | POA: Diagnosis not present

## 2024-03-25 DIAGNOSIS — M6281 Muscle weakness (generalized): Secondary | ICD-10-CM

## 2024-03-25 NOTE — Therapy (Signed)
 " OUTPATIENT PHYSICAL THERAPY EVALUATION   Patient Name: Ann Morgan MRN: [id number] DOB:1971-01-26, 54 y.o., female Today's Date: 03/25/2024  END OF SESSION:  PT End of Session - 03/25/24 0946     Visit Number 1    Number of Visits 6    Date for Recertification  05/06/24    Authorization Type BCBS    PT Start Time [phone]    PT Stop Time 0930    PT Time Calculation (min) 40 min    Activity Tolerance Patient tolerated treatment well    Behavior During Therapy [facility] for tasks assessed/performed          Past Medical History:  Diagnosis Date   Abnormal Pap smear of cervix    Asthma    Endometrial hyperplasia    Hypertension 2004   Irregular menses    Mild intermittent asthma with acute exacerbation 10/19/2013   Late 2013 with a cough that lasted approximately one month.  PFTs showed FEV1 of 83%  Last Assessment & Plan:  Reports that since she was prescribed albuterol, she has used it maybe 1x to see how she would respond to it which seemed to help. However, she still has some coughing issues but thinks it is manageable. No issues with high pollen season.   Plan: - Continue with albuterol PRN.  - She will   Obesity    Past Surgical History:  Procedure Laterality Date   BREAST CYST ASPIRATION Left 2015   benign   CYSTOSCOPY  08/31/2021   Procedure: CYSTOSCOPY;  Surgeon: Leonce Garnette BIRCH, MD;  Location: [facility] ORS;  Service: Gynecology;;   KNEE SURGERY Left jan 2011   orthoscopic   ROBOTIC ASSISTED LAPAROSCOPIC LYSIS OF ADHESION  08/31/2021   Procedure: XI ROBOTIC ASSISTED LAPAROSCOPIC LYSIS OF ADHESION;  Surgeon: Leonce Garnette BIRCH, MD;  Location: [facility] ORS;  Service: Gynecology;;   ROBOTIC ASSISTED LAPAROSCOPIC OVARIAN CYSTECTOMY Left 08/31/2021   Procedure: XI ROBOTIC ASSISTED LAPAROSCOPIC LEFT SALPINGO OOPHORECTOMY CYSTECTOMY;  Surgeon: Leonce Garnette BIRCH, MD;  Location: [facility] ORS;  Service: Gynecology;  Laterality: Left;   SHOULDER ARTHROSCOPY WITH SUBACROMIAL DECOMPRESSION,  ROTATOR CUFF REPAIR AND BICEP TENDON REPAIR Right 12/25/2018   Procedure: right shoulder arthroscopy, biceps tendon release with tenodesis, three tendon rotator cuff repair;  Surgeon: Addie Cordella Hamilton, MD;  Location: [facility] OR;  Service: Orthopedics;  Laterality: Right;   Patient Active Problem List   Diagnosis Date Noted   Meralgia paraesthetica, right 11/30/2022   Weight gain 11/30/2022   Adenoma of left adrenal gland 11/30/2022   Mixed hyperlipidemia 11/09/2022   Meralgia paresthetica of right side 11/09/2022   Adrenal nodule 11/09/2021   Abdominal pain, left lower quadrant 08/31/2021   Left ovarian cyst 08/31/2021   Ovary, ovarian pedicle and fallopian tube torsion 08/31/2021   Abdominal pain 08/23/2021   Disorder of bursae of shoulder region 05/12/2021   Knee pain 05/12/2021   Tear of lateral cartilage or meniscus of knee, current 05/12/2021   Pain in joint of left knee 09/12/2020   Complications affecting other specified body systems, hypertension 09/02/2020   Muscle weakness 09/02/2020   Traumatic complete tear of right rotator cuff 04/10/2019   Epidermal cyst of neck 02/17/2018   Skin abnormalities 07/15/2017   Endometrial hyperplasia without atypia, simple 05/17/2017   Patellofemoral pain syndrome of both knees 02/04/2017   Vasovagal syncope 02/04/2017   Glucose intolerance 01/01/2017   Overactive bladder 01/01/2017   Glucose intolerance 01/01/2017   Body mass index (BMI) of 45.0-49.9 in  adult [facility]) 09/19/2015   Injury of left wrist 05/09/2015   Screening for STD (sexually transmitted disease) 04/06/2015   Varicose veins of both lower extremities with pain 10/27/2014   Mild intermittent asthma with acute exacerbation 10/19/2013   Breast cyst 12/11/2012   Abnormal mammogram 06/09/2012   Premenopausal menorrhagia 04/28/2012   Polycystic ovaries 03/29/2010   Class 3 severe obesity due to excess calories in adult [facility]) 03/29/2010   Essential hypertension 03/29/2010     PCP: Gareth Mliss FALCON, FNP  REFERRING PROVIDER: Burnetta Brunet, DO  REFERRING DIAG: [phone] (ICD-10-CM) - Chronic midline low back pain without sciatica M51.360 (ICD-10-CM) - Degeneration of intervertebral disc of lumbar region with discogenic back pain  Rationale for Evaluation and Treatment: Rehabilitation  THERAPY DIAG:  Other low back pain - Plan: PT plan of care cert/re-cert  Muscle weakness (generalized) - Plan: PT plan of care cert/re-cert  ONSET DATE: 03/05/24   SUBJECTIVE:                                                                                                                                                                                           SUBJECTIVE STATEMENT: Pt is a pleasant 54 y.o. female who presents today for acute on chronic low back pain. Ann Morgan states that about three weeks ago, she was lifting a table and felt her lower back catch. She did have some difficulty walking and then bending forward was pretty uncomfortable.  Over time pain has gradually improved, but has mildly increased on Lt side over the past week.   PERTINENT HISTORY:  Lt knee scope (2011), Rt RTC repair, asthma, HTN  PAIN:  Are you having pain? Yes: NPRS scale: currently 2, up to 5, at best 0/10 Pain location: low back, Lt side Pain description: dull ache Aggravating factors: lying on side, roll over in bed, getting up after sitting for >20 min, bending forward with reaching, sitting in office chair Relieving factors: lying on back, movement  PRECAUTIONS:  None  RED FLAGS: None   WEIGHT BEARING RESTRICTIONS:  No  FALLS:  Has patient fallen in last 6 months? No  LIVING ENVIRONMENT: Lives with: lives with their spouse Lives in: House/apartment Stairs: denies difficulty  OCCUPATION:  Full-time: works from [employer]; Part-time: event planning  PLOF:  Independent and Leisure: entertaining, travel to beach, no regular exercise - is getting  on treadmill 2x/wk  PATIENT GOALS:  Improve pain and exercises to help decrease risk of reinjury   OBJECTIVE:  Note: Objective measures were completed at Evaluation unless otherwise noted.  DIAGNOSTIC FINDINGS:  lumbar spine x-rays which show mild degenerative disc  disease with anterior spurring at the L3-L4 region  PATIENT SURVEYS:  Patient-Specific Activity Scoring Scheme  0 represents unable to perform. 10 represents able to perform at prior level. 0 1 2 3 4 5 6 7 8 9  10 (Date and Score)   Activity Eval     1. Bending forward  8    2. Rolling over in bed 7     Score 7.5    Total score = sum of the activity scores/number of activities Minimum detectable change (90%CI) for average score = 2 points Minimum detectable change (90%CI) for single activity score = 3 points    COGNITIVE STATUS: Within functional limits for tasks assessed   SENSATION: WFL  POSTURE:  rounded shoulders, forward head, and increased lumbar lordosis  GAIT: 03/25/2024 Comments: independent   PALPATION: 03/25/2024 tenderness to palpation Lt glute med/QL, mild pain around L5/S1   LUMBAR ROM:   ROM A/PROM  eval  Flexion WNL  Extension WNL  Right quadrant WNL with Lt side pain  Left quadrant WNL   (Blank rows = not tested)     LOWER EXTREMITY MMT:    MMT Right eval Left eval  Hip flexion 4/5 4/5  Hip extension 3/5 3/5  Knee flexion 4/5 4/5  Knee extension 5/5 5/5   (Blank rows = not tested)     TREATMENT:                                                                                                                              DATE:  03/25/2024 TherEx See HEP - demonstrated with trial reps performed PRN, mod cues for comprehension  Also discussed working back into app based exercise programs she was doing prior, as well as pilates/barre recommendations for low impact strength training  Self Care Educated on PT POC and clinical findings; discussed benefits of strength  training  Educated on use of tennis ball to [employer] and [employer] med    PATIENT EDUCATION:  Education details: HEP Person educated: Patient Education method: Programmer, Multimedia, Facilities Manager, and Handouts Education comprehension: verbalized understanding, returned demonstration, and needs further education  HOME EXERCISE PROGRAM: Access Code: [email or web address] URL: [email or web address] Date: 03/25/2024 Prepared by: Corean Ku  Exercises - Supine Double Knee to Chest  - 2 x daily - 7 x weekly - 1 sets - 3 reps - 30 sec hold - Hooklying Single Knee to Chest  - 2 x daily - 7 x weekly - 1 sets - 3 reps - 30 sec hold - Supine Lower Trunk Rotation  - 2 x daily - 7 x weekly - 1 sets - 3 reps - 30 sec hold - Supine Posterior Pelvic Tilt  - 2 x daily - 7 x weekly - 1 sets - 10 reps - 5 sec hold - Supine Bridge  - 2 x daily - 7 x weekly - 1 sets - 10 reps - 5 sec hold - Bird Dog  - 1 x daily -  7 x weekly - 3 sets - 10 reps - Quadruped Fire Hydrant  - 1 x daily - 7 x weekly - 3 sets - 10 reps - Quadruped Hip Extension Kicks  - 1 x daily - 7 x weekly - 3 sets - 10 reps - Cat Cow  - 1 x daily - 7 x weekly - 1 sets - 10 reps - Standing Lumbar Spine Flexion Stretch Counter  - 1 x daily - 7 x weekly - 1 set - 3-5 reps   ASSESSMENT:  CLINICAL IMPRESSION: Patient is a 54 y.o. female who was seen today for physical therapy evaluation and treatment for low back pain. She demonstrates postural abnormalities, mild strength deficits and continued pain affecting functional mobility.  She will benefit from PT to address deficits listed.   OBJECTIVE IMPAIRMENTS: decreased strength, increased fascial restrictions, postural dysfunction, and pain.   ACTIVITY LIMITATIONS: carrying, lifting, bending, transfers, and locomotion level  PARTICIPATION LIMITATIONS: meal prep, cleaning, laundry, driving, shopping, community activity, and occupation  PERSONAL FACTORS: Fitness, Past/current experiences, Time since  onset of injury/illness/exacerbation, and 3+ comorbidities: Lt knee scope (2011), Rt RTC repair, asthma, HTN are also affecting patient's functional outcome.   REHAB POTENTIAL: Good  CLINICAL DECISION MAKING: Stable/uncomplicated  EVALUATION COMPLEXITY: Low   GOALS: Goals reviewed with patient? Yes  SHORT TERM GOALS: Target date: 04/15/2024  Independent with initial HEP Goal status: INITIAL   LONG TERM GOALS: Target date: 05/06/2024  Independent with final HEP Goal status: INITIAL  2.  PSFS score improved by 2 points Goal status: INITIAL  3.  Lumbar ROM performed without pain for improved functional mobility Goal status: INIITAL  4.  Report pain < 3/10 with bending and transfers for improved function Goal status: INITIAL  PLAN:  PT FREQUENCY: 1x/week (plan to see every other week, will see up to 1x/wk PRN)  PT DURATION: 6 weeks  PLANNED INTERVENTIONS: 97164- PT Re-evaluation, 97750- Physical Performance Testing, 97110-Therapeutic exercises, 97530- Therapeutic activity, 97112- Neuromuscular re-education, 97535- Self Care, 02859- Manual therapy, [id number]- Aquatic Therapy, H9716- Electrical stimulation (unattended), [id number]- Ultrasound, 02987- Traction (mechanical), [id number]- Ionotophoresis 4mg /ml Dexamethasone , 79439 (1-2 muscles), 20561 (3+ muscles)- Dry Needling, Patient/Family education, Taping, Joint mobilization, Joint manipulation, Spinal manipulation, Spinal mobilization, Cryotherapy, and Moist heat.  PLAN FOR NEXT SESSION: Review HEP, progress core/hip/back strengthening (add weights)   NEXT MD VISIT: PRN   Corean JULIANNA Ku, PT, DPT 03/25/2024 9:56 AM   "

## 2024-04-09 ENCOUNTER — Encounter: Admitting: Physical Therapy

## 2024-05-05 ENCOUNTER — Encounter

## 2024-05-18 ENCOUNTER — Encounter: Admitting: Nurse Practitioner

## 2024-11-30 ENCOUNTER — Ambulatory Visit: Admitting: Internal Medicine
